# Patient Record
Sex: Male | Born: 1955 | Race: White | Hispanic: No | Marital: Married | Smoking: Never smoker
Health system: Southern US, Community
[De-identification: ages and names within clinical notes are randomized; demographics above are authoritative.]

## PROBLEM LIST (undated history)

## (undated) DIAGNOSIS — K219 Gastro-esophageal reflux disease without esophagitis: Secondary | ICD-10-CM

## (undated) DIAGNOSIS — E039 Hypothyroidism, unspecified: Secondary | ICD-10-CM

## (undated) HISTORY — PX: OTHER SURGICAL HISTORY: SHX169

---

## 2001-06-01 HISTORY — PX: HERNIA REPAIR: SHX51

## 2004-08-29 ENCOUNTER — Ambulatory Visit: Payer: Self-pay | Admitting: Surgery

## 2005-09-23 ENCOUNTER — Ambulatory Visit: Payer: Self-pay | Admitting: Unknown Physician Specialty

## 2014-05-16 DIAGNOSIS — J309 Allergic rhinitis, unspecified: Secondary | ICD-10-CM | POA: Insufficient documentation

## 2014-05-16 DIAGNOSIS — E034 Atrophy of thyroid (acquired): Secondary | ICD-10-CM | POA: Insufficient documentation

## 2016-02-20 DIAGNOSIS — R030 Elevated blood-pressure reading, without diagnosis of hypertension: Secondary | ICD-10-CM | POA: Insufficient documentation

## 2016-07-02 DIAGNOSIS — M5412 Radiculopathy, cervical region: Secondary | ICD-10-CM | POA: Insufficient documentation

## 2016-07-03 ENCOUNTER — Other Ambulatory Visit: Payer: Self-pay | Admitting: Internal Medicine

## 2016-07-03 DIAGNOSIS — M5412 Radiculopathy, cervical region: Secondary | ICD-10-CM

## 2016-07-17 ENCOUNTER — Ambulatory Visit
Admission: RE | Admit: 2016-07-17 | Discharge: 2016-07-17 | Disposition: A | Discharge status: Home / Self Care | Payer: BLUE CROSS/BLUE SHIELD | Source: Ambulatory Visit | Attending: Internal Medicine | Admitting: Internal Medicine

## 2016-07-17 DIAGNOSIS — M50123 Cervical disc disorder at C6-C7 level with radiculopathy: Secondary | ICD-10-CM | POA: Diagnosis not present

## 2016-07-17 DIAGNOSIS — M5412 Radiculopathy, cervical region: Secondary | ICD-10-CM

## 2016-10-05 ENCOUNTER — Ambulatory Visit
Admission: EM | Admit: 2016-10-05 | Discharge: 2016-10-05 | Disposition: A | Discharge status: Home / Self Care | Payer: BLUE CROSS/BLUE SHIELD | Attending: Family Medicine | Admitting: Family Medicine

## 2016-10-05 ENCOUNTER — Emergency Department: Payer: BLUE CROSS/BLUE SHIELD

## 2016-10-05 DIAGNOSIS — S82832A Other fracture of upper and lower end of left fibula, initial encounter for closed fracture: Secondary | ICD-10-CM | POA: Insufficient documentation

## 2016-10-05 DIAGNOSIS — X501XXA Overexertion from prolonged static or awkward postures, initial encounter: Secondary | ICD-10-CM | POA: Diagnosis not present

## 2016-10-05 DIAGNOSIS — Y929 Unspecified place or not applicable: Secondary | ICD-10-CM | POA: Insufficient documentation

## 2016-10-05 DIAGNOSIS — Y999 Unspecified external cause status: Secondary | ICD-10-CM | POA: Diagnosis not present

## 2016-10-05 DIAGNOSIS — S99912A Unspecified injury of left ankle, initial encounter: Secondary | ICD-10-CM | POA: Diagnosis present

## 2016-10-05 DIAGNOSIS — R2242 Localized swelling, mass and lump, left lower limb: Secondary | ICD-10-CM | POA: Diagnosis not present

## 2016-10-05 DIAGNOSIS — M79605 Pain in left leg: Secondary | ICD-10-CM

## 2016-10-05 DIAGNOSIS — Y939 Activity, unspecified: Secondary | ICD-10-CM | POA: Insufficient documentation

## 2016-10-05 DIAGNOSIS — M7989 Other specified soft tissue disorders: Secondary | ICD-10-CM

## 2016-10-05 HISTORY — DX: Gastro-esophageal reflux disease without esophagitis: K21.9

## 2016-10-05 HISTORY — DX: Hypothyroidism, unspecified: E03.9

## 2016-10-05 NOTE — ED Provider Notes (Signed)
CSN: [id number]     Arrival date & time 10/05/16  1944 History   First MD Initiated Contact with Patient 10/05/16 2023     Chief Complaint  Patient presents with  . Leg Pain    left   (Consider location/radiation/quality/duration/timing/severity/associated sxs/prior Treatment) Patient is a 61 year old male with past medical history of hypothyroidism and GERD presents with complaint of left lower leg swelling. Patient states that he was out of town trip last week in [place]. On Tuesday, patient states he was walking on uneven pavement when he twists his ankle and had a hard fall. Patient reports bruising and redness to the to the lateral foot as well as the ankle. Patient reports that the bruising, redness and the pain in the ankle and foot has improved but reports he does hobble when walking. Patient states that he flew back home from [place] on Thursday night. Reports the flight was 2.5 hours total for the 2 flights without stopping. Patient also reports some tenderness to the tibial plateau area he says is related to a hard [place] the [place] Thursday night that caused him to hit his leg on the seat in front of him. 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  Patient denies any shortness of breath or chest pain. Patient denies any weakness or dizziness but does report some sinus headaches that are part of his normal seasonal allergies.      Past Medical History:  Diagnosis Date  . GERD (gastroesophageal reflux disease)   . Hypothyroid    Past Surgical History:  Procedure Laterality Date  . HERNIA REPAIR  2003   History reviewed. No pertinent family history. Social History  Substance Use Topics  . Smoking status: Never  Smoker  . Smokeless tobacco: Never Used  . Alcohol use Yes     Comment: occasionally    Review of Systems  Constitutional: Negative.  Negative for chills, diaphoresis and fatigue.  HENT:       Seasonal allergy symptoms  Eyes: Negative.   Respiratory: Negative for choking and shortness of breath.   Cardiovascular: Positive for leg swelling. Negative for chest pain and palpitations.  Genitourinary: Negative.   Musculoskeletal:       Bruising to the foot with foot and ankle swelling after fall. Hobbling while walking  Neurological: Positive for headaches. Negative for dizziness, seizures, weakness and numbness.    Allergies  Patient has no known allergies.  Home Medications   Prior to Admission medications   Medication Sig Start Date End Date Taking? Authorizing Provider  fexofenadine (ALLEGRA) 180 MG tablet Take 180 mg by mouth daily.   Yes [provider]  ibuprofen (ADVIL,MOTRIN) 200 MG tablet Take 200 mg by mouth every 6 (six) hours as needed.   Yes [provider]  Levothyroxine Sodium 150 MCG CAPS Take by mouth daily before breakfast.   Yes [provider]  omeprazole (PRILOSEC) 20 MG capsule Take 20 mg by mouth daily.   Yes [provider]   Meds Ordered and Administered this Visit  Medications - No data to display  BP (!) 150/98 (BP Location: Left Arm)   Pulse 85   Temp 98.4 F (36.9 C) (Oral)  Resp 18   Ht 6' (1.829 m)   Wt 282 lb (127.9 kg)   SpO2 100%   BMI 38.25 kg/m  No data found.   Physical Exam  Constitutional: He is oriented to person, place, and time. He appears well-developed and well-nourished.  HENT:  Head: Normocephalic and atraumatic.  Eyes: EOM are normal. Pupils are equal, round, and reactive to light.  Neck: Normal range of motion. Neck supple.  Cardiovascular: Normal rate and regular rhythm.   Pulmonary/Chest: Effort normal and breath sounds normal.  Abdominal: Soft. Bowel sounds are normal.    Musculoskeletal:       Left lower leg: He exhibits swelling and edema.       Legs:      Left foot: There is tenderness and swelling (unable to reach around calf with both hands. Swelling and edema worsened compared to the right.).       Feet:  Homans sign negative  Neurological: He is alert and oriented to person, place, and time.  Skin: Skin is warm and dry.    Urgent Care Course     Procedures (including critical care time)  Labs Review Labs Reviewed - No data to display  Imaging Review No results found.       MDM   1. Leg swelling   2. Left leg pain    With the bruising and swelling to foot and the patient's reported fall, there is concern for foot injury including sprain and fracture. However with the trauma, the travel time, and the progressive swelling of the leg, also have a concern for a DVT. Swelling on the left leg is to the point where I cannot reach my 2 hands on his leg and is much bigger than the right. Recommendation is for the patient to present himself to the ER patient reports that he will go to the [facility] ER for further workup. At this point in time, patient will need a ultrasound Doppler of the lower leg vessels which is not available at this facility. And, as the patient is to present to ER. Will leave further imaging or workup of the leg injury to the [facility] staff. Patient provided concerns for possible DVT and palms occur right should it worsen or possibly break off leading to complement the heart or lungs. Patient verbalizes understanding for the plan and reasons for the recommendation to go the ER. Patient with both are in agreement with the plan.  Candis SchatzMichael D Tylin Force, PA-C       Candis SchatzHarris, Janeah Kovacich D, PA-C 10/05/16 2116

## 2016-10-05 NOTE — ED Triage Notes (Signed)
Patient complains of left leg pain that occurred after a fall 6 days ago. Patient has swelling and tightness that worsened after a hard landing in an airplane where leg slammed against metal frame of seat. Patient has bruising at foot covering toes and around ankle. Patient reports sharp pain across shin radiating up to knee.

## 2016-10-05 NOTE — ED Triage Notes (Signed)
Pt fell 6 days ago and injured his left ankle did not see md due to being on business trip. States also had 2 hr plane ride home, and now has increased swelling and bruising to left leg and foot.

## 2016-10-05 NOTE — Discharge Instructions (Signed)
-   given swelling, travel and recent injury, there is a concern for a clot in the lower leg - recommend going to local ER now for evaluation and imaging for both the fall injuries and for possible clot - not going to ER could result in further swelling and if there is a clot, it can break off and move towards the heart and lungs.

## 2016-10-06 ENCOUNTER — Emergency Department: Payer: BLUE CROSS/BLUE SHIELD

## 2016-10-06 ENCOUNTER — Emergency Department
Admission: EM | Admit: 2016-10-06 | Discharge: 2016-10-06 | Disposition: A | Discharge status: Home / Self Care | Payer: BLUE CROSS/BLUE SHIELD | Attending: Emergency Medicine | Admitting: Emergency Medicine

## 2016-10-06 DIAGNOSIS — S82832A Other fracture of upper and lower end of left fibula, initial encounter for closed fracture: Secondary | ICD-10-CM

## 2016-10-06 DIAGNOSIS — R609 Edema, unspecified: Secondary | ICD-10-CM

## 2016-10-06 MED ORDER — IBUPROFEN 800 MG PO TABS
800.0000 mg | ORAL_TABLET | Freq: Once | ORAL | Status: AC
  Administered 2016-10-06: 800 mg via ORAL

## 2016-10-06 MED ORDER — IBUPROFEN 800 MG PO TABS
ORAL_TABLET | ORAL | Status: AC
  Administered 2016-10-06: 800 mg via ORAL
  Filled 2016-10-06: qty 1

## 2016-10-06 MED ORDER — OXYCODONE-ACETAMINOPHEN 5-325 MG PO TABS: 1.0000 | ORAL_TABLET | ORAL | 0 refills | Status: DC | PRN

## 2016-10-06 NOTE — ED Provider Notes (Signed)
[facility] Emergency Department Provider Note   First MD Initiated Contact with Patient 10/06/16 0110     (approximate)  I have reviewed the triage vital signs and the nursing notes.   HISTORY  Chief Complaint Leg Swelling   HPI Ian Howard is a 61 y.o. male with below list of chronic medical conditions presents to the emergency department with history of twisting his left ankle 6 days ago with progressive swelling following the injury. Patient states that he was away on business at a time and a such did not seek medical attention. Patient states today swelling has worsened. Patient states current pain score is 2 out of 10 worse with ambulation. Patient denies any chest pain or shortness of breath no previous history of DVT or pulmonary emboli.   Past Medical History:  Diagnosis Date  . GERD (gastroesophageal reflux disease)   . Hypothyroid     There are no active problems to display for this patient.   Past Surgical History:  Procedure Laterality Date  . HERNIA REPAIR  2003    Prior to Admission medications   Medication Sig Start Date End Date Taking? Authorizing Provider  ALPRAZolam Prudy Feeler) 0.5 MG tablet Take 0.5 mg by mouth 2 (two) times daily as needed for anxiety.   Yes [provider]  fexofenadine (ALLEGRA) 180 MG tablet Take 180 mg by mouth daily.   Yes [provider]  ibuprofen (ADVIL,MOTRIN) 200 MG tablet Take 200 mg by mouth every 6 (six) hours as needed.   Yes [provider]  Levothyroxine Sodium 150 MCG CAPS Take by mouth daily before breakfast.   Yes [provider]  omeprazole (PRILOSEC) 20 MG capsule Take 20 mg by mouth daily.   Yes [provider]  oxyCODONE-acetaminophen (ROXICET) 5-325 MG tablet Take 1 tablet by mouth every 4 (four) hours as needed for severe pain. 10/06/16   Darci Current, MD    Allergies Patient has no known allergies.  No family history on file.  Social  History Social History  Substance Use Topics  . Smoking status: Never Smoker  . Smokeless tobacco: Never Used  . Alcohol use Yes     Comment: occasionally    Review of Systems Constitutional: No fever/chills Eyes: No visual changes. ENT: No sore throat. Cardiovascular: Denies chest pain. Respiratory: Denies shortness of breath. Gastrointestinal: No abdominal pain.  No nausea, no vomiting.  No diarrhea.  No constipation. Genitourinary: Negative for dysuria. Musculoskeletal: Negative for back pain.Positive for left ankle pain and swelling and bruising Integumentary: Negative for rash. Neurological: Negative for headaches, focal weakness or numbness.   ____________________________________________   PHYSICAL EXAM:  VITAL SIGNS: ED Triage Vitals  Enc Vitals Group     BP 10/05/16 2110 (!) 150/81     Pulse Rate 10/05/16 2110 80     Resp 10/05/16 2110 16     Temp 10/05/16 2110 98.4 F (36.9 C)     Temp Source 10/05/16 2110 Oral     SpO2 10/05/16 2110 98 %     Weight 10/05/16 2112 282 lb (127.9 kg)     Height 10/05/16 2112 6' (1.829 m)     Head Circumference --      Peak Flow --      Pain Score 10/05/16 2114 0     Pain Loc --      Pain Edu? --      Excl. in GC? --     Constitutional: Alert and oriented.  Well appearing and in no acute distress. Eyes: Conjunctivae are normal. PERRL. EOMI. Head: Atraumatic. Mouth/Throat: Mucous membranes are moist.  Oropharynx non-erythematous. Neck: No stridor.   Cardiovascular: Normal rate, regular rhythm. Good peripheral circulation. Grossly normal heart sounds. Respiratory: Normal respiratory effort.  No retractions. Lungs CTAB. Gastrointestinal: Soft and nontender. No distention.  Musculoskeletal: No lower extremity tenderness nor edema. No gross deformities of extremities. Neurologic:  Normal speech and language. No gross focal neurologic deficits are appreciated.  Skin:  Skin is warm, dry and intact. No rash noted. Psychiatric:  Mood and affect are normal. Speech and behavior are normal.   RADIOLOGY I, [place] N Solimar Maiden, personally viewed and evaluated these images (plain radiographs) as part of my medical decision making, as well as reviewing the written report by the radiologist.  Dg Ankle Complete Left  Result Date: 10/06/2016 CLINICAL DATA:  Injury. Fall 6 days prior, lateral malleolus pain and swelling. EXAM: LEFT ANKLE COMPLETE - 3+ VIEW COMPARISON:  None. FINDINGS: Oblique mildly displaced fracture of the distal fibula at the level of the ankle mortise. No widening of the lateral clear space. The ankle mortise is preserved. No additional acute fracture. Diffuse soft tissue edema is most prominent laterally. IMPRESSION: Mildly displaced distal fibular fracture at the level of the ankle mortise. Electronically Signed   By: Rubye Oaks M.D.   On: 10/06/2016 01:43   US Venous Img Lower Unilateral Left  Result Date: 10/05/2016 CLINICAL DATA:  Status post fall, with left foot and ankle swelling and bruising. Initial encounter. EXAM: LEFT LOWER EXTREMITY VENOUS DOPPLER ULTRASOUND TECHNIQUE: Gray-scale sonography with graded compression, as well as color Doppler and duplex ultrasound were performed to evaluate the lower extremity deep venous systems from the level of the common femoral vein and including the common femoral, femoral, profunda femoral, popliteal and calf veins including the posterior tibial, peroneal and gastrocnemius veins when visible. The superficial great saphenous vein was also interrogated. Spectral Doppler was utilized to evaluate flow at rest and with distal augmentation maneuvers in the common femoral, femoral and popliteal veins. COMPARISON:  None. FINDINGS: Contralateral Common Femoral Vein: Respiratory phasicity is normal and symmetric with the symptomatic side. No evidence of thrombus. Normal compressibility. Common Femoral Vein: No evidence of thrombus. Normal compressibility, respiratory phasicity  and response to augmentation. Saphenofemoral Junction: No evidence of thrombus. Normal compressibility and flow on color Doppler imaging. Profunda Femoral Vein: No evidence of thrombus. Normal compressibility and flow on color Doppler imaging. Femoral Vein: No evidence of thrombus. Normal compressibility, respiratory phasicity and response to augmentation. Popliteal Vein: No evidence of thrombus. Normal compressibility, respiratory phasicity and response to augmentation. Calf Veins: No evidence of thrombus. Normal compressibility and flow on color Doppler imaging. The peroneal vein is not visualized. Superficial Great Saphenous Vein: No evidence of thrombus. Normal compressibility and flow on color Doppler imaging. Venous Reflux:  None. Other Findings:  None. IMPRESSION: No evidence of DVT within the left lower extremity. Electronically Signed   By: Roanna Raider M.D.   On: 10/05/2016 23:21      .Splint Application Date/Time: 10/06/2016 2:16 AM Performed by: Darci Current Authorized by: Darci Current   Consent:    Consent obtained:  Verbal   Consent given by:  Patient   Risks discussed:  Pain Pre-procedure details:    Sensation:  Normal   Skin color:  Lateral ecchymosis Procedure details:    Laterality:  Left   Location:  Ankle   Ankle:  L ankle  Splint type:  Ankle stirrup   Supplies:  Ortho-Glass Post-procedure details:    Pain:  Improved   Sensation:  Normal   Skin color:  Same   Patient tolerance of procedure:  Tolerated well, no immediate complications     ____________________________________________   INITIAL IMPRESSION / ASSESSMENT AND PLAN / ED COURSE  Pertinent labs & imaging results that were available during my care of the patient were reviewed by me and considered in my medical decision making (see chart for details).  61 year old male presenting with left leg pain swelling status post twisting his ankle 6 days ago. Ultrasound performed before my evaluation  the patient revealed no DVT. X-ray of the patient's ankle revealed a distal fibular mildly displaced fracture. Patient will be referred to Dr. Odis LusterBowers orthopedic surgeon    ____________________________________________  FINAL CLINICAL IMPRESSION(S) / ED DIAGNOSES  Final diagnoses:  Other closed fracture of distal end of left fibula, initial encounter     MEDICATIONS GIVEN DURING THIS VISIT:  Medications  ibuprofen (ADVIL,MOTRIN) tablet 800 mg (800 mg Oral Given 10/06/16 0130)     NEW OUTPATIENT MEDICATIONS STARTED DURING THIS VISIT:  New Prescriptions   OXYCODONE-ACETAMINOPHEN (ROXICET) 5-325 MG TABLET    Take 1 tablet by mouth every 4 (four) hours as needed for severe pain.    Modified Medications   No medications on file    Discontinued Medications   No medications on file     Note:  This document was prepared using Dragon voice recognition software and may include unintentional dictation errors.    Darci CurrentBrown, [place] N, MD 10/06/16 [phone]

## 2016-10-06 NOTE — ED Notes (Signed)
Splint in place per this RN. Sugar tong in place without posterior backing, bilateral support provided. Pedal pulses and capillary refill checked per this RN post splint application.

## 2017-07-16 DIAGNOSIS — R3129 Other microscopic hematuria: Secondary | ICD-10-CM | POA: Diagnosis not present

## 2017-07-27 DIAGNOSIS — K219 Gastro-esophageal reflux disease without esophagitis: Secondary | ICD-10-CM | POA: Diagnosis not present

## 2017-07-27 DIAGNOSIS — K588 Other irritable bowel syndrome: Secondary | ICD-10-CM | POA: Diagnosis not present

## 2017-07-27 DIAGNOSIS — E034 Atrophy of thyroid (acquired): Secondary | ICD-10-CM | POA: Diagnosis not present

## 2018-03-19 DIAGNOSIS — Z23 Encounter for immunization: Secondary | ICD-10-CM | POA: Diagnosis not present

## 2018-05-17 DIAGNOSIS — N62 Hypertrophy of breast: Secondary | ICD-10-CM | POA: Diagnosis not present

## 2018-05-17 DIAGNOSIS — R3129 Other microscopic hematuria: Secondary | ICD-10-CM | POA: Diagnosis not present

## 2018-05-17 DIAGNOSIS — R030 Elevated blood-pressure reading, without diagnosis of hypertension: Secondary | ICD-10-CM | POA: Diagnosis not present

## 2018-05-17 DIAGNOSIS — N529 Male erectile dysfunction, unspecified: Secondary | ICD-10-CM | POA: Diagnosis not present

## 2018-05-24 DIAGNOSIS — E034 Atrophy of thyroid (acquired): Secondary | ICD-10-CM | POA: Diagnosis not present

## 2018-05-24 DIAGNOSIS — K219 Gastro-esophageal reflux disease without esophagitis: Secondary | ICD-10-CM | POA: Diagnosis not present

## 2018-05-24 DIAGNOSIS — R3129 Other microscopic hematuria: Secondary | ICD-10-CM | POA: Diagnosis not present

## 2018-05-26 ENCOUNTER — Other Ambulatory Visit: Payer: Self-pay | Admitting: Internal Medicine

## 2018-05-26 DIAGNOSIS — M5412 Radiculopathy, cervical region: Secondary | ICD-10-CM

## 2018-06-08 DIAGNOSIS — Z800 Family history of malignant neoplasm of digestive organs: Secondary | ICD-10-CM | POA: Diagnosis not present

## 2018-06-08 DIAGNOSIS — Z86010 Personal history of colonic polyps: Secondary | ICD-10-CM | POA: Diagnosis not present

## 2018-06-08 DIAGNOSIS — Z8371 Family history of colonic polyps: Secondary | ICD-10-CM | POA: Diagnosis not present

## 2018-06-10 ENCOUNTER — Ambulatory Visit
Admission: RE | Admit: 2018-06-10 | Discharge: 2018-06-10 | Disposition: A | Discharge status: Home / Self Care | Payer: 59 | Source: Ambulatory Visit | Attending: Internal Medicine | Admitting: Internal Medicine

## 2018-06-10 ENCOUNTER — Encounter (INDEPENDENT_AMBULATORY_CARE_PROVIDER_SITE_OTHER): Payer: Self-pay

## 2018-06-10 DIAGNOSIS — M5412 Radiculopathy, cervical region: Secondary | ICD-10-CM | POA: Insufficient documentation

## 2018-06-10 DIAGNOSIS — M542 Cervicalgia: Secondary | ICD-10-CM | POA: Diagnosis not present

## 2018-06-11 ENCOUNTER — Other Ambulatory Visit
Admission: RE | Admit: 2018-06-11 | Discharge: 2018-06-11 | Disposition: A | Discharge status: Home / Self Care | Payer: 59 | Source: Ambulatory Visit | Attending: Student | Admitting: Student

## 2018-06-11 DIAGNOSIS — R197 Diarrhea, unspecified: Secondary | ICD-10-CM | POA: Insufficient documentation

## 2018-06-11 LAB — GASTROINTESTINAL PANEL BY PCR, STOOL (REPLACES STOOL CULTURE)
ADENOVIRUS F40/41: NOT DETECTED
ASTROVIRUS: NOT DETECTED
CAMPYLOBACTER SPECIES: NOT DETECTED
CYCLOSPORA CAYETANENSIS: NOT DETECTED
Cryptosporidium: NOT DETECTED
ENTAMOEBA HISTOLYTICA: NOT DETECTED
ENTEROPATHOGENIC E COLI (EPEC): NOT DETECTED
ENTEROTOXIGENIC E COLI (ETEC): NOT DETECTED
Enteroaggregative E coli (EAEC): NOT DETECTED
Giardia lamblia: NOT DETECTED
NOROVIRUS GI/GII: NOT DETECTED
Plesimonas shigelloides: NOT DETECTED
Rotavirus A: NOT DETECTED
Salmonella species: NOT DETECTED
Sapovirus (I, II, IV, and V): NOT DETECTED
Shiga like toxin producing E coli (STEC): NOT DETECTED
Shigella/Enteroinvasive E coli (EIEC): NOT DETECTED
VIBRIO SPECIES: NOT DETECTED
Vibrio cholerae: NOT DETECTED
Yersinia enterocolitica: NOT DETECTED

## 2018-06-11 LAB — C DIFFICILE QUICK SCREEN W PCR REFLEX
C Diff antigen: NEGATIVE
C Diff interpretation: NOT DETECTED
C Diff toxin: NEGATIVE

## 2018-06-13 LAB — CALPROTECTIN, FECAL: CALPROTECTIN, FECAL: 77 ug/g (ref 0–120)

## 2018-06-14 ENCOUNTER — Ambulatory Visit: Payer: BLUE CROSS/BLUE SHIELD

## 2018-06-16 LAB — PANCREATIC ELASTASE, FECAL: PANCREATIC ELASTASE-1, STL: 293 ug Elast./g (ref >200)

## 2018-06-17 DIAGNOSIS — D649 Anemia, unspecified: Secondary | ICD-10-CM | POA: Diagnosis not present

## 2018-06-21 DIAGNOSIS — M5442 Lumbago with sciatica, left side: Secondary | ICD-10-CM | POA: Diagnosis not present

## 2018-06-21 DIAGNOSIS — M545 Low back pain: Secondary | ICD-10-CM | POA: Diagnosis not present

## 2018-06-21 DIAGNOSIS — G8929 Other chronic pain: Secondary | ICD-10-CM | POA: Diagnosis not present

## 2018-06-21 DIAGNOSIS — M5030 Other cervical disc degeneration, unspecified cervical region: Secondary | ICD-10-CM | POA: Diagnosis not present

## 2018-06-21 DIAGNOSIS — M5412 Radiculopathy, cervical region: Secondary | ICD-10-CM | POA: Diagnosis not present

## 2018-06-28 DIAGNOSIS — M5412 Radiculopathy, cervical region: Secondary | ICD-10-CM | POA: Diagnosis not present

## 2018-06-28 DIAGNOSIS — M6281 Muscle weakness (generalized): Secondary | ICD-10-CM | POA: Diagnosis not present

## 2018-07-14 DIAGNOSIS — M6281 Muscle weakness (generalized): Secondary | ICD-10-CM | POA: Diagnosis not present

## 2018-07-14 DIAGNOSIS — M5412 Radiculopathy, cervical region: Secondary | ICD-10-CM | POA: Diagnosis not present

## 2018-07-20 DIAGNOSIS — M5412 Radiculopathy, cervical region: Secondary | ICD-10-CM | POA: Diagnosis not present

## 2018-07-20 DIAGNOSIS — M6281 Muscle weakness (generalized): Secondary | ICD-10-CM | POA: Diagnosis not present

## 2018-08-02 DIAGNOSIS — M5412 Radiculopathy, cervical region: Secondary | ICD-10-CM | POA: Diagnosis not present

## 2018-08-02 DIAGNOSIS — M5030 Other cervical disc degeneration, unspecified cervical region: Secondary | ICD-10-CM | POA: Diagnosis not present

## 2018-08-02 DIAGNOSIS — M5442 Lumbago with sciatica, left side: Secondary | ICD-10-CM | POA: Diagnosis not present

## 2018-11-28 DIAGNOSIS — Z6841 Body Mass Index (BMI) 40.0 and over, adult: Secondary | ICD-10-CM | POA: Insufficient documentation

## 2019-02-02 NOTE — Progress Notes (Signed)
[facility]  [address], [address] [place], [place] [address] Phone: [phone]  Fax: [phone]   Clinic Day:  02/03/2019  Referring physician: Lavera Guise, [place]*  Chief Complaint: Ian Howard is a 63 y.o. male with iron deficiency anemia who is referred in consultation by Reita Cliche, PA for assessment and management.   HPI: The patient was diagnosed with iron deficiency anemia by his gastroenterologist.  Hemoglobin decreased from 14.0 to 12.4 (06/08/2018 to 12/16/2018).  Ferritin was 12 with an iron saturation 20% on 16-Dec-2018.  Labs on 12/27/2018 revealed hemoglobin 11.1.  Ferritin was 8 with an iron saturation 26%.  He has been mildly fatigued.  He describes taking oral iron and vitamin C without improvement but with constipation.  He was been on Niferex since 12/27/2018.  Colonoscopy on 01/03/2019 by Dr  Alice Reichert revealed non-thrombosed external hemorrhoids and internal hemorrhoids that prolapse with straining, but require manual replacement in the anal canal (grade III) found on perianal exam. There was diverticulosis in the left colon. There was one 27mm polyp in the sigmoid colon (tubular adenoma). There were non-bleeding internal hemorrhoids. The examination was otherwise normal.  EGD on 01/03/2019 revealed normal esophagus, multiple gastric polyps, 2 cm hiatal hernia, gastritis, and normal examined duodenum. Pathology revealed mild chronic gastritis, negative for H pylori.   Capsule study on 01/11/2019 was normal.  CBC has been followed: 06/17/2018: Hematocrit 42.8, hemoglobin 14.0, MCV 88.6, platelets 204,000, WBC 6100. 12-16-18: Hematocrit 39.7, hemoglobin 12.4, MCV 84.5, platelets 268,000, WBC 7300.  Reticulocyte count 1.53% 12/27/2018: Hematocrit 36.3, hemoglobin 11.1, MCV 81.9, platelets 271,000, WBC 6300.  Ferritin was 12 on Dec 16, 2018 and 8 on 12/27/2018.    Additional studies included a normal CMP on 2018/12/16.  TSH was 3.16 on  16-Dec-2018.  Urinalysis on 2018-12-16 revealed a small amount of blood.  He has had persistent hematuria dating back to 11/14/2013 on multiple urinalysis.  He has a history of GERD, IBS, adenomatous colon polyps, and a family history of colon cancer.   He states that his diet is a low carbs.  He has an intermittent fasting.  He eats meat daily.  He eats green leafy vegetables 2 times a week.  He takes a multivitamin daily.    Symptomatically, he gets tired periodically.  He notes hemorrhoidal bleeding greater than 50% of the time.  He sometimes sits on the toilet for 10 minutes with blood dripping.  He has an appointment with surgery, Dr Windell Moment, in 10 days.  He denies any gross hematuria.  He has had intentional weight loss.  He feels clammy and uncomfortable.  Stools are dark on iron.  He has been taking Niferex 150 mg a day past 3 weeks (started 12/27/2018).  He has an aunt and a cousin with leukemia.  His father had lung cancer.  Maternal aunt had colon cancer.   Past Medical History:  Diagnosis Date  . GERD (gastroesophageal reflux disease)   . Hypothyroid     Past Surgical History:  Procedure Laterality Date  . gall bladder remove    . HERNIA REPAIR  2003    Family History  Problem Relation Age of Onset  . Lung cancer Father   . Colon cancer Maternal Aunt     Social History:  reports that he has never smoked. He has never used smokeless tobacco. He reports current alcohol use. He reports that he does not use drugs.  He drinks 2 glasses of wine in the evenings.  He denies any exposure to radiations or toxins.  He works in Dance movement psychotherapist.  He lives in [place].  The patient is alone today.  Allergies: No Known Allergies  Current Medications: Current Outpatient Medications  Medication Sig Dispense Refill  . ALBUTEROL SULFATE IN Inhale into the lungs.    . ALPRAZolam (XANAX) 0.5 MG tablet Take 0.5 mg by mouth 2 (two) times daily as needed for anxiety.    . Doxycycline  Hyclate 50 MG TBEC Take 50 mg by mouth daily.    . fexofenadine (ALLEGRA) 180 MG tablet Take 180 mg by mouth daily.    . fluticasone (FLONASE) 50 MCG/ACT nasal spray Place 2 sprays into both nostrils daily.    Marland Kitchen gabapentin (NEURONTIN) 300 MG capsule Take 300 mg by mouth 2 (two) times daily.    Marland Kitchen ibuprofen (ADVIL,MOTRIN) 200 MG tablet Take 200 mg by mouth every 6 (six) hours as needed.    . iron polysaccharides (NIFEREX) 150 MG capsule Take 150 mg by mouth daily.    . Levothyroxine Sodium 150 MCG CAPS Take by mouth daily before breakfast.    . omeprazole (PRILOSEC) 20 MG capsule Take 20 mg by mouth daily.    . pseudoephedrine (SUDAFED) 30 MG tablet Take 30 mg by mouth every 4 (four) hours as needed for congestion.    Marland Kitchen oxyCODONE-acetaminophen (ROXICET) 5-325 MG tablet Take 1 tablet by mouth every 4 (four) hours as needed for severe pain. 20 tablet 0  . sildenafil (VIAGRA) 100 MG tablet Take 100 mg by mouth as needed for erectile dysfunction.     No current facility-administered medications for this visit.     Review of Systems  Constitutional: Negative.  Negative for chills, diaphoresis, fever, malaise/fatigue and weight loss (intensional).       Tired at times.  Feels rundown.  HENT: Positive for congestion (sinus due to hayfever). Negative for ear pain, hearing loss, nosebleeds and sore throat.   Eyes: Negative.  Negative for blurred vision, double vision and pain.  Respiratory: Negative.  Negative for cough, shortness of breath and wheezing.   Cardiovascular: Negative for chest pain, palpitations, orthopnea, leg swelling and PND.  Gastrointestinal: Positive for blood in stool (hemorrhoidal bleeding) and constipation. Negative for abdominal pain, diarrhea, melena, nausea and vomiting.       Dark stool on oral iron.  Genitourinary: Positive for urgency (at times). Negative for dysuria, frequency and hematuria.       H/o microscopic hematuria.  Musculoskeletal: Negative.  Negative for back  pain, joint pain and myalgias.  Skin: Negative.  Negative for rash.  Neurological: Positive for headaches (daily and in the back of his head). Negative for dizziness, tingling, sensory change, speech change, focal weakness and weakness.  Endo/Heme/Allergies: Does not bruise/bleed easily.       Hypothyroid on levothyroxine.  Psychiatric/Behavioral: Negative.  Negative for depression and memory loss. The patient is not nervous/anxious and does not have insomnia.   All other systems reviewed and are negative.  Performance status (ECOG): 1  Vitals Blood pressure 134/84, pulse 63, temperature (!) 97.3 F (36.3 C), temperature source Tympanic, resp. rate 18, height 6' (1.829 m), weight 282 lb 15.4 oz (128.4 kg), SpO2 100 %.   Physical Exam  Constitutional: He is oriented to person, place, and time. He appears well-developed and well-nourished. No distress.  HENT:  Head: Normocephalic and atraumatic.  Mouth/Throat: Oropharynx is clear and moist. No oropharyngeal exudate.  Gray hair and [employer].  Mask.  Eyes: Pupils are equal,  round, and reactive to light. Conjunctivae and EOM are normal. No scleral icterus.  Blue eyes.  Neck: Normal range of motion. Neck supple.  Cardiovascular: Normal rate, regular rhythm and normal heart sounds.  No murmur heard. Pulmonary/Chest: Effort normal and breath sounds normal. No respiratory distress. He has no wheezes.  Abdominal: Soft. Bowel sounds are normal. He exhibits no distension and no mass. There is no abdominal tenderness. There is no rebound and no guarding.  No appreciable hepatosplenomegaly.  Musculoskeletal: Normal range of motion.        General: No tenderness or edema.  Lymphadenopathy:    He has no cervical adenopathy.    He has no axillary adenopathy.       Right: No supraclavicular adenopathy present.       Left: No supraclavicular adenopathy present.  Neurological: He is alert and oriented to person, place, and time.  Skin: Skin is warm and  dry. No rash noted. He is not diaphoretic. No erythema. No pallor.  Psychiatric: He has a normal mood and affect. His behavior is normal. Judgment and thought content normal.  Nursing note and vitals reviewed.   No visits with results within 3 Day(s) from this visit.  Latest known visit with results is:  Hospital Outpatient Visit on 06/11/2018  Component Date Value Ref Range Status  . Campylobacter species 06/10/2018 NOT DETECTED  NOT DETECTED Final  . Plesimonas shigelloides 06/10/2018 NOT DETECTED  NOT DETECTED Final  . Salmonella species 06/10/2018 NOT DETECTED  NOT DETECTED Final  . Yersinia enterocolitica 06/10/2018 NOT DETECTED  NOT DETECTED Final  . Vibrio species 06/10/2018 NOT DETECTED  NOT DETECTED Final  . Vibrio cholerae 06/10/2018 NOT DETECTED  NOT DETECTED Final  . Enteroaggregative E coli (EAEC) 06/10/2018 NOT DETECTED  NOT DETECTED Final  . Enteropathogenic E coli (EPEC) 06/10/2018 NOT DETECTED  NOT DETECTED Final  . Enterotoxigenic E coli (ETEC) 06/10/2018 NOT DETECTED  NOT DETECTED Final  . Shiga like toxin producing E coli * 06/10/2018 NOT DETECTED  NOT DETECTED Final  . Shigella/Enteroinvasive E coli (EI* 06/10/2018 NOT DETECTED  NOT DETECTED Final  . Cryptosporidium 06/10/2018 NOT DETECTED  NOT DETECTED Final  . Cyclospora cayetanensis 06/10/2018 NOT DETECTED  NOT DETECTED Final  . Entamoeba histolytica 06/10/2018 NOT DETECTED  NOT DETECTED Final  . Giardia lamblia 06/10/2018 NOT DETECTED  NOT DETECTED Final  . Adenovirus F40/41 06/10/2018 NOT DETECTED  NOT DETECTED Final  . Astrovirus 06/10/2018 NOT DETECTED  NOT DETECTED Final  . Norovirus GI/GII 06/10/2018 NOT DETECTED  NOT DETECTED Final  . Rotavirus A 06/10/2018 NOT DETECTED  NOT DETECTED Final  . Sapovirus (I, II, IV, and V) 06/10/2018 NOT DETECTED  NOT DETECTED Final   Performed at [facility], [address] [place], [place] [address]  . C Diff antigen 06/10/2018 NEGATIVE  NEGATIVE Final  . C  Diff toxin 06/10/2018 NEGATIVE  NEGATIVE Final  . C Diff interpretation 06/10/2018 No C. difficile detected.   Final   Performed at [facility], [address] [place], [place] [address]  . Calprotectin, Fecal 06/10/2018 77  0 - 120 ug/g Final   Comment: (NOTE) Concentration     Interpretation   Follow-Up <16 - 50 ug/g     Normal           None >50 -120 ug/g     Borderline       Re-evaluate in 4-6 weeks    >120 ug/g     Abnormal  Repeat as clinically                                   indicated Performed At: [facility] [address] [place], [place] [id number] Jolene SchimkeNagendra Sanjai MD NW:[id number]:[phone]   . Pancreatic Elastase-1, Stool 06/10/2018 293  >200 ug Elast./g Final   Comment: (NOTE)       Severe Pancreatic Insufficiency:          <100       Moderate Pancreatic Insufficiency:   100 - 200       Normal:                                   >200 Performed At: [facility] [address] [place], [place] [id number] Jolene SchimkeNagendra Sanjai MD BM:[id number]:[phone]     Assessment:  Ian Howard is a 63 y.o. male with progressive normocytic anemia since 10/2018.  He has a longstanding history of microscopic hematuria.  He has hemorrhoidal bleeding > 50% of the days; bleeding appears mild.  Diet is good.  Oral iron has caused constipation in the past.  He has been on Niferex since 12/27/2018.  Colonoscopy on 01/03/2019 revealed diverticulosis in the left colon. There was one 5mm polyp in the sigmoid colon (tubular adenoma). There were non-bleeding internal hemorrhoids. EGD on 01/03/2019 revealed normal esophagus, multiple gastric polyps, 2 cm hiatal hernia, gastritis, and normal examined duodenum. Pathology revealed mild chronic gastritis, negative for H pylori.  Capsule study on 01/11/2019 was normal.  He has had microscopic hematuria since 11/14/2013.  He has iron deficiency.  Ferritin was 12 on 11/17/2018 and 8 on 12/27/2018.    Additional studies included a normal CMP and  TSH on 11/17/2018.    Symptomatically, he feels tired and rundown.  He denies any gross hematuria.  Stools are dark on oral iron.  Exam is unremarkable.  Plan: 1. Labs today: CBC with diff, ferritin, iron studies, B12, folate, retic.   2. Iron deficiency anemia    Etiology appears related to hemorrhoidal bleeding and possibly persistent microscopic hematuria.  Patient has had an EGD, colonoscopy and capsule study.   No obvious GI blood loss except hemorrhoidal.   Discuss 25% risk of missing an abnormality on a capsule study.  Diet appears good.  Discuss urinalysis to reassess for hematuria.  Patient has been on oral iron x 3 weeks.  Discuss additional week to assess benefit.  Discuss consideration of IV iron if hemoglobin and ferritin do not increase.   Potential side effects reviewed   Preauth Venofer. 3. RTC in 5-7 days for MD assessment (Doximity) and review of work-up.  I discussed the assessment and treatment plan with the patient.  The patient was provided an opportunity to ask questions and all were answered.  The patient agreed with the plan and demonstrated an understanding of the instructions.  The patient was advised to call back if the symptoms worsen or if the condition fails to improve as anticipated.   Melissa C. Merlene Pullingorcoran, MD, PhD    02/03/2019, 2:52 PM

## 2019-02-03 ENCOUNTER — Inpatient Hospital Stay: Payer: 59

## 2019-02-03 ENCOUNTER — Other Ambulatory Visit: Payer: Self-pay

## 2019-02-03 ENCOUNTER — Inpatient Hospital Stay: Payer: 59 | Attending: Hematology and Oncology | Admitting: Hematology and Oncology

## 2019-02-03 ENCOUNTER — Encounter: Payer: Self-pay | Admitting: Hematology and Oncology

## 2019-02-03 VITALS — BP 134/84 | HR 63 | Temp 97.3°F | Resp 18 | Ht 72.0 in | Wt 283.0 lb

## 2019-02-03 DIAGNOSIS — K2950 Unspecified chronic gastritis without bleeding: Secondary | ICD-10-CM

## 2019-02-03 DIAGNOSIS — D509 Iron deficiency anemia, unspecified: Secondary | ICD-10-CM

## 2019-02-03 DIAGNOSIS — R5383 Other fatigue: Secondary | ICD-10-CM | POA: Insufficient documentation

## 2019-02-03 DIAGNOSIS — R3129 Other microscopic hematuria: Secondary | ICD-10-CM | POA: Insufficient documentation

## 2019-02-03 DIAGNOSIS — D649 Anemia, unspecified: Secondary | ICD-10-CM | POA: Insufficient documentation

## 2019-02-03 DIAGNOSIS — K589 Irritable bowel syndrome without diarrhea: Secondary | ICD-10-CM | POA: Insufficient documentation

## 2019-02-03 DIAGNOSIS — K219 Gastro-esophageal reflux disease without esophagitis: Secondary | ICD-10-CM | POA: Insufficient documentation

## 2019-02-03 DIAGNOSIS — K648 Other hemorrhoids: Secondary | ICD-10-CM | POA: Diagnosis not present

## 2019-02-03 LAB — IRON AND TIBC
Iron: 30 ug/dL — ABNORMAL LOW (ref 45–182)
Saturation Ratios: 6 % — ABNORMAL LOW (ref 17.9–39.5)
TIBC: 474 ug/dL — ABNORMAL HIGH (ref 250–450)
UIBC: 444 ug/dL

## 2019-02-03 LAB — FOLATE: Folate: 21.9 ng/mL (ref >5.9)

## 2019-02-03 LAB — RETICULOCYTES
Immature Retic Fract: 22.4 % — ABNORMAL HIGH (ref 2.3–15.9)
RBC.: 4.6 MIL/uL (ref 4.22–5.81)
Retic Count, Absolute: 55.2 10*3/uL (ref 19.0–186.0)
Retic Ct Pct: 1.2 % (ref 0.4–3.1)

## 2019-02-03 LAB — CBC WITH DIFFERENTIAL/PLATELET
Abs Immature Granulocytes: 0.02 10*3/uL (ref 0.00–0.07)
Basophils Absolute: 0.1 10*3/uL (ref 0.0–0.1)
Basophils Relative: 1 %
Eosinophils Absolute: 0.2 10*3/uL (ref 0.0–0.5)
Eosinophils Relative: 2 %
HCT: 35.5 % — ABNORMAL LOW (ref 39.0–52.0)
Hemoglobin: 11 g/dL — ABNORMAL LOW (ref 13.0–17.0)
Immature Granulocytes: 0 %
Lymphocytes Relative: 25 %
Lymphs Abs: 2 10*3/uL (ref 0.7–4.0)
MCH: 24 pg — ABNORMAL LOW (ref 26.0–34.0)
MCHC: 31 g/dL (ref 30.0–36.0)
MCV: 77.3 fL — ABNORMAL LOW (ref 80.0–100.0)
Monocytes Absolute: 0.7 10*3/uL (ref 0.1–1.0)
Monocytes Relative: 9 %
Neutro Abs: 5 10*3/uL (ref 1.7–7.7)
Neutrophils Relative %: 63 %
Platelets: 319 10*3/uL (ref 150–400)
RBC: 4.59 MIL/uL (ref 4.22–5.81)
RDW: 15.8 % — ABNORMAL HIGH (ref 11.5–15.5)
WBC: 8 10*3/uL (ref 4.0–10.5)
nRBC: 0 % (ref 0.0–0.2)

## 2019-02-03 LAB — FERRITIN: Ferritin: 7 ng/mL — ABNORMAL LOW (ref 24–336)

## 2019-02-04 LAB — VITAMIN B12: Vitamin B-12: 347 pg/mL (ref 180–914)

## 2019-02-12 NOTE — Progress Notes (Signed)
[facility]  [address], [address] [place], [place] [address] Phone: [phone]  Fax: [phone]   Telemedicine Office Visit:  02/14/2019  Referring physician: Lynnea FerrierKlein, Bert J III, MD  I connected with Ian HeringJohn T Howard on 02/14/2019 at 11:47 AM by videoconferencing and verified that I was speaking with the correct person using 2 identifiers.  The patient was at home.  I discussed the limitations, risk, security and privacy concerns of performing an evaluation and management service by videoconferencing and the availability of in person appointments.  I also discussed with the patient that there may be a patient responsible charge related to this service.  The patient expressed understanding and agreed to proceed.   Chief Complaint: Ian HeringJohn T Howard is a 63 y.o. male with iron deficiency anemia who is seen for review of work-up and discussion regarding direction of therapy.   HPI:  The patient was last seen in the hematology clinic for an initial consult on 02/03/2019. At that time, he felt tired and rundown. He denied any gross hematuria. Stools were dark on oral iron. Exam was unremarkable. Etiology of his anemia appeared related to hemorrhoidal bleeding and possibly persistent microscopic hematuria.  He had been on oral iron x 3 weeks.  We discussed a trial of oral iron and if unsuccessful, IV iron.  Work-up included a hematocrit 35.5, hemoglobin 11.0, MCV 77.3, platelets 319,000, WBC 8,000. Ferritin was 7 with an iron saturation of 6% and a TIBC of 474. Vitamin B12 was 347. Folate was 21.9. Retic was 1.2%. Urinalysis revealed no hematuria.  During the interim, the patient has felt "good". He will see a surgeon about his hemorrhoidal bleeding. He notes some shortness of breath on exertion, headaches, and 1-2 episodes of dizziness.   He is taking 250-500 mcg of oral B12 a day.   Past Medical History:  Diagnosis Date  . GERD (gastroesophageal reflux disease)   .  Hypothyroid     Past Surgical History:  Procedure Laterality Date  . gall bladder remove    . HERNIA REPAIR  2003    Family History  Problem Relation Age of Onset  . Lung cancer Father   . Colon cancer Maternal Aunt     Social History:  reports that he has never smoked. He has never used smokeless tobacco. He reports current alcohol use. He reports that he does not use drugs. He drinks 2 glasses of wine in the evenings.  He denies any exposure to radiations or toxins.  He works in Dance movement psychotherapistcomputer engineering.  [place] in [place]. The patient is accompanied by his wife today.  Participants in the patient's visit and their role in the encounter included the patient, his wife, and Bronwen BettersCourtney Grissett, CMA, today.  The intake visit was provided by Bronwen Bettersourtney Grissett, CMA.  Allergies: No Known Allergies  Current Medications: Current Outpatient Medications  Medication Sig Dispense Refill  . ALBUTEROL SULFATE IN Inhale into the lungs.    . ALPRAZolam (XANAX) 0.5 MG tablet Take 0.5 mg by mouth 2 (two) times daily as needed for anxiety.    . Doxycycline Hyclate 50 MG TBEC Take 50 mg by mouth daily.    . fexofenadine (ALLEGRA) 180 MG tablet Take 180 mg by mouth daily.    . fluticasone (FLONASE) 50 MCG/ACT nasal spray Place 2 sprays into both nostrils daily.    Marland Kitchen. gabapentin (NEURONTIN) 300 MG capsule Take 300 mg by mouth 2 (two) times daily.    . iron polysaccharides (NIFEREX)  150 MG capsule Take 150 mg by mouth daily.    . Levothyroxine Sodium 150 MCG CAPS Take by mouth daily before breakfast.    . omeprazole (PRILOSEC) 20 MG capsule Take 20 mg by mouth daily.    Marland Kitchen ibuprofen (ADVIL,MOTRIN) 200 MG tablet Take 200 mg by mouth every 6 (six) hours as needed.    . pseudoephedrine (SUDAFED) 30 MG tablet Take 30 mg by mouth every 4 (four) hours as needed for congestion.    . sildenafil (VIAGRA) 100 MG tablet Take 100 mg by mouth as needed for erectile dysfunction.     No current facility-administered  medications for this visit.     Review of Systems  Constitutional: Negative.  Negative for chills, diaphoresis, fever, malaise/fatigue and weight loss.  HENT: Negative for congestion, ear pain, hearing loss, nosebleeds and sore throat.   Eyes: Negative.  Negative for blurred vision, double vision and pain.  Respiratory: Positive for shortness of breath (exertional). Negative for cough and wheezing.   Cardiovascular: Negative.  Negative for chest pain, palpitations, orthopnea, leg swelling and PND.  Gastrointestinal: Positive for blood in stool (hemorrhoidal bleeding- see HPI). Negative for abdominal pain, constipation, diarrhea, melena, nausea and vomiting.       Dark stool on oral iron.  Genitourinary: Negative for dysuria, frequency, hematuria and urgency.       H/o microscopic hematuria.  Musculoskeletal: Negative.  Negative for back pain, joint pain and myalgias.  Skin: Negative.  Negative for rash.  Neurological: Positive for dizziness (1-2 episodes) and headaches (few). Negative for tingling, sensory change, speech change, focal weakness and weakness.  Endo/Heme/Allergies: Does not bruise/bleed easily.       Hypothyroid on levothyroxine.  Psychiatric/Behavioral: Negative.  Negative for depression and memory loss. The patient is not nervous/anxious and does not have insomnia.   All other systems reviewed and are negative.   Performance status (ECOG): 1  Physical Exam  Constitutional: He is oriented to person, place, and time. He appears well-developed and well-nourished. No distress.  HENT:  Head: Normocephalic and atraumatic.  Short gray hair and goatee.  Eyes: Conjunctivae and EOM are normal. No scleral icterus.  Glasses.  Blue eyes.  Neurological: He is alert and oriented to person, place, and time.  Psychiatric: He has a normal mood and affect. His behavior is normal. Judgment and thought content normal.  Nursing note reviewed.   Appointment on 02/13/2019  Component Date  Value Ref Range Status  . Color, Urine 02/13/2019 YELLOW  YELLOW Final  . APPearance 02/13/2019 CLEAR  CLEAR Final  . Specific Gravity, Urine 02/13/2019 1.020  1.005 - 1.030 Final  . pH 02/13/2019 7.0  5.0 - 8.0 Final  . Glucose, UA 02/13/2019 NEGATIVE  NEGATIVE mg/dL Final  . Hgb urine dipstick 02/13/2019 NEGATIVE  NEGATIVE Final  . Bilirubin Urine 02/13/2019 NEGATIVE  NEGATIVE Final  . Ketones, ur 02/13/2019 NEGATIVE  NEGATIVE mg/dL Final  . Protein, ur 02/13/2019 NEGATIVE  NEGATIVE mg/dL Final  . Nitrite 02/13/2019 NEGATIVE  NEGATIVE Final  . Chalmers Guest 02/13/2019 NEGATIVE  NEGATIVE Final  . Squamous Epithelial / LPF 02/13/2019 0-5  0 - 5 Final  . Non Squamous Epithelial 02/13/2019 PRESENT* NONE SEEN Final  . WBC, UA 02/13/2019 0-5  0 - 5 WBC/hpf Final  . RBC / HPF 02/13/2019 0-5  0 - 5 RBC/hpf Final  . Bacteria, UA 02/13/2019 FEW* NONE SEEN Final  . Mucus 02/13/2019 PRESENT   Final   Performed at [facility], [phone]  [address], [place], [place] [address]    Assessment:  Ian Howard is a 63 y.o. male with iron deficiency anemia.  He has had a progressive normocytic anemia since 10/2018.  He has a longstanding history of microscopic hematuria.  He has hemorrhoidal bleeding > 50% of the days; bleeding appears mild.  Diet is good.  Oral iron has caused constipation in the past.  He has been on Niferex since 12/27/2018.  Work-up on 02/03/2019 included a hematocrit 35.5, hemoglobin 11.0, MCV 77.3, platelets 319,000, WBC 8,000. Ferritin was 7 with an iron saturation of 6% and a TIBC of 474. B12 (347) and folate (21.9) were normal. Retic was 1.2%.  Urinalysis revealed no hematuria.  Additional studies included a normal CMP and TSH on 11/17/2018.    Colonoscopy on 01/03/2019 revealed diverticulosis in the left colon. There was one 43mm polyp in the sigmoid colon (tubular adenoma). There were non-bleeding internal hemorrhoids. EGD on 01/03/2019 revealed normal esophagus, multiple  gastric polyps, 2 cm hiatal hernia, gastritis, and normal examined duodenum. Pathology revealed mild chronic gastritis, negative for H pylori.  Capsule study on 01/11/2019 was normal.  He has had microscopic hematuria since 11/14/2013.  He has iron deficiency.  Ferritin was 12 on 11/17/2018, 8 on 12/27/2018, and 7 on 02/03/2019.    Symptomatically, he is doing well.  He notes a little shortness of breath on exertion.  Plan: 1.   Review labs from 02/03/2019. 2.   Iron deficiency anemia   Clinically, he is doing well.  Hematocrit 35.5.  Hemoglobin 11.0.  MCV 77.3.  Etiology appears secondary to hemorrhoidal bleeding.  No evidence of microscopic hematuria.             Patient has had an EGD, colonoscopy and capsule study.                         There is a 25% risk of missing an abnormality on a capsule study.             Diet remains good.             Discuss continuation of oral iron with vitamin C.  Increase oral iron to twice a day.  Current ferritin is 7.  Ferritin goal is 100. 3.   Low normal B12  Patient taking vitamin B12 250-500 mcg a day.  B12 was 347.    B12 goal is 400.  Continue to monitor. 4.   RTC prn.  Patient would like follow-up with Dr. Graciela Husbands.  I discussed the assessment and treatment plan with the patient.  The patient was provided an opportunity to ask questions and all were answered.  The patient agreed with the plan and demonstrated an understanding of the instructions.  The patient was advised to call back or seek an in person evaluation if the symptoms worsen or if the condition fails to improve as anticipated.  I provided 10 minutes (11:47 AM - 11:57 AM) of face-to-face video visit time during this this encounter and > 50% was spent counseling as documented under my assessment and plan.  I provided these services from the [facility] office.   Nelva Nay, MD, PhD  02/14/2019, 11:47 AM  I, Theador Hawthorne, am acting as scribe for [employer]. Merlene Pulling, MD,  PhD.  I, Melissa C. Merlene Pulling, MD, have reviewed the above documentation for accuracy and completeness, and I agree with the above.

## 2019-02-13 ENCOUNTER — Other Ambulatory Visit: Payer: Self-pay

## 2019-02-13 ENCOUNTER — Inpatient Hospital Stay: Payer: 59

## 2019-02-13 DIAGNOSIS — D509 Iron deficiency anemia, unspecified: Secondary | ICD-10-CM | POA: Diagnosis not present

## 2019-02-13 LAB — URINALYSIS, COMPLETE (UACMP) WITH MICROSCOPIC
Bilirubin Urine: NEGATIVE
Glucose, UA: NEGATIVE mg/dL
Hgb urine dipstick: NEGATIVE
Ketones, ur: NEGATIVE mg/dL
Leukocytes,Ua: NEGATIVE
Nitrite: NEGATIVE
Protein, ur: NEGATIVE mg/dL
Specific Gravity, Urine: 1.02 (ref 1.005–1.030)
pH: 7 (ref 5.0–8.0)

## 2019-02-14 ENCOUNTER — Inpatient Hospital Stay (HOSPITAL_BASED_OUTPATIENT_CLINIC_OR_DEPARTMENT_OTHER): Payer: 59 | Admitting: Hematology and Oncology

## 2019-02-14 ENCOUNTER — Encounter: Payer: Self-pay | Admitting: Hematology and Oncology

## 2019-02-14 DIAGNOSIS — D509 Iron deficiency anemia, unspecified: Secondary | ICD-10-CM

## 2019-02-14 NOTE — Progress Notes (Signed)
No new changes noted today. The patient name and DOB has been verified by phone today. 

## 2019-09-07 ENCOUNTER — Other Ambulatory Visit: Payer: Self-pay

## 2019-09-07 ENCOUNTER — Ambulatory Visit: Payer: 59 | Attending: Internal Medicine

## 2019-09-07 DIAGNOSIS — Z23 Encounter for immunization: Secondary | ICD-10-CM

## 2019-09-07 NOTE — Progress Notes (Signed)
   Covid-19 Vaccination Clinic  Name:  Ian Howard    MRN: [id number] DOB: 03-27-1956  09/07/2019  Ian Howard was observed post Covid-19 immunization for 15 minutes without incident. He was provided with Vaccine Information Sheet and instruction to access the V-Safe system.   Ian Howard was instructed to call 911 with any severe reactions post vaccine: Marland Kitchen Difficulty breathing  . Swelling of face and throat  . A fast heartbeat  . A bad rash all over body  . Dizziness and weakness   Immunizations Administered    Name Date Dose VIS Date Route   Pfizer COVID-19 Vaccine 09/07/2019 10:20 AM 0.3 mL 05/12/2019 Intramuscular   Manufacturer: [employer], [employer]   Lot: [id number]   NDC: [address]-8

## 2019-10-04 ENCOUNTER — Ambulatory Visit: Payer: 59 | Attending: Internal Medicine

## 2019-10-04 DIAGNOSIS — Z23 Encounter for immunization: Secondary | ICD-10-CM

## 2019-10-04 NOTE — Progress Notes (Signed)
   Covid-19 Vaccination Clinic  Name:  FESTUS PURSEL    MRN: [id number] DOB: Dec 31, 1955  10/04/2019  Mr. Kye was observed post Covid-19 immunization for 15 minutes without incident. He was provided with Vaccine Information Sheet and instruction to access the V-Safe system.   Mr. Blazier was instructed to call 911 with any severe reactions post vaccine: Marland Kitchen Difficulty breathing  . Swelling of face and throat  . A fast heartbeat  . A bad rash all over body  . Dizziness and weakness   Immunizations Administered    Name Date Dose VIS Date Route   Pfizer COVID-19 Vaccine 10/04/2019  9:56 AM 0.3 mL 07/26/2018 Intramuscular   Manufacturer: [employer], [employer]   Lot: [id number]   NDC: [address]-7

## 2020-05-18 IMAGING — MR MR CERVICAL SPINE W/O CM
5 series · 34 of 48 positions shown · non-contrast
Comparison: 07/17/2016

CLINICAL DATA: Cervical radiculopathy. Neck pain radiating down
both arms

EXAM:
MRI CERVICAL SPINE WITHOUT CONTRAST
TECHNIQUE: Multiplanar, multisequence MR imaging of the cervical spine was
performed. No intravenous contrast was administered.

[Series 2: T2 · sagittal · 3.0mm · 0.56mm/px · 7 of 13 slices shown (1 of 2)]
[im 1/13]
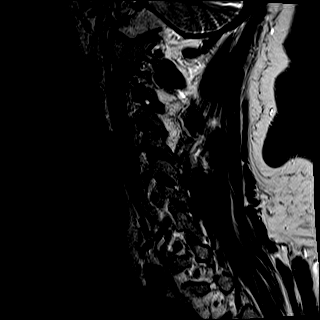
[im 3/13]
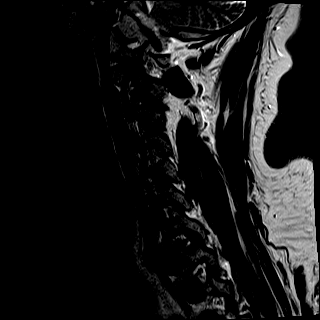
[im 5/13]
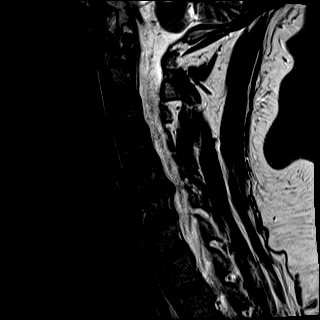
[im 7/13]
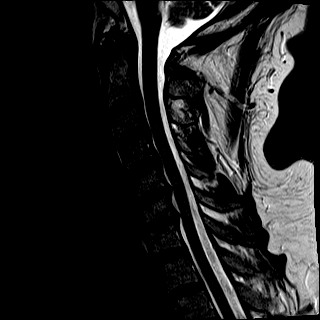
[im 9/13]
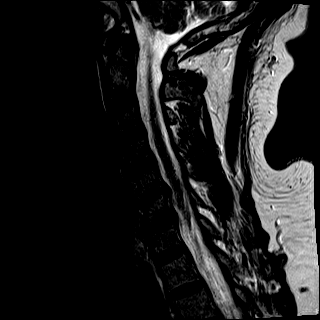
[im 11/13]
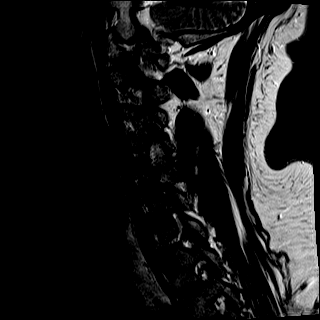
[im 13/13]
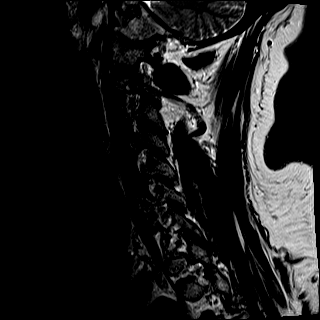

[Series 3: T1 · sagittal · 3.0mm · 0.70mm/px · 7 of 13 slices shown]
[im 1/13]
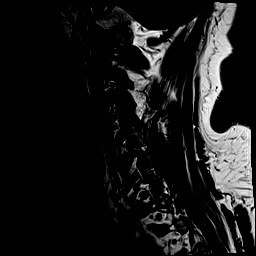
[im 3/13]
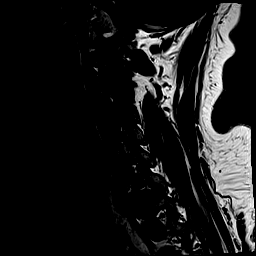
[im 5/13]
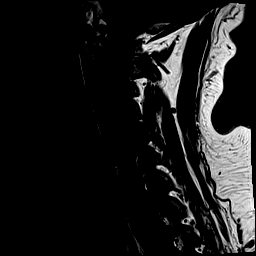
[im 7/13]
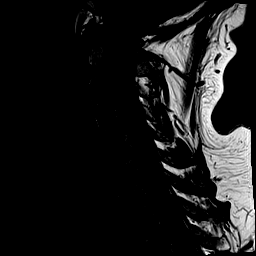
[im 9/13]
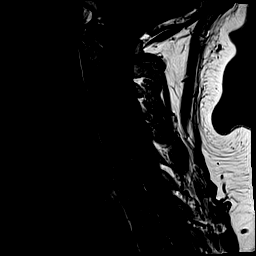
[im 11/13]
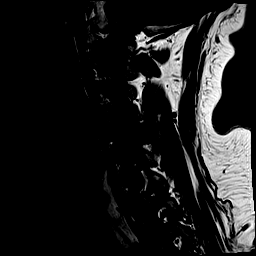
[im 13/13]
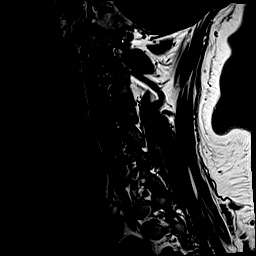

[Series 4: STIR · sagittal · 3.0mm · 0.35mm/px · 6 of 13 slices shown]
[im 1/13]
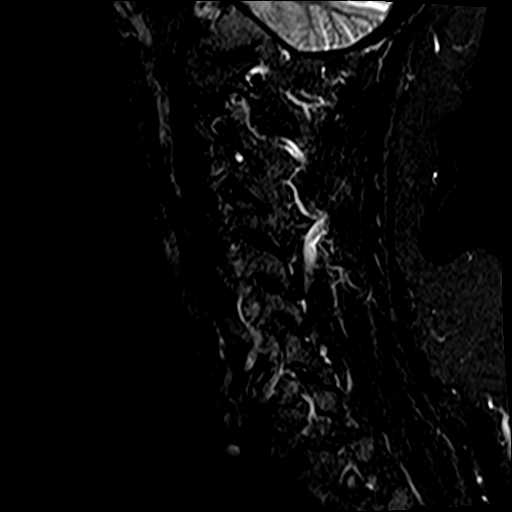
[im 3/13]
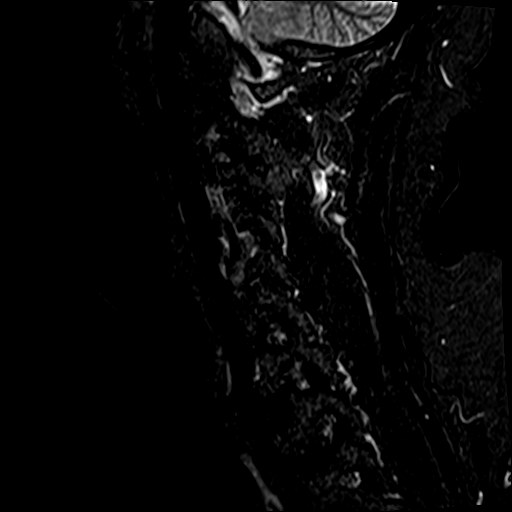
[im 5/13]
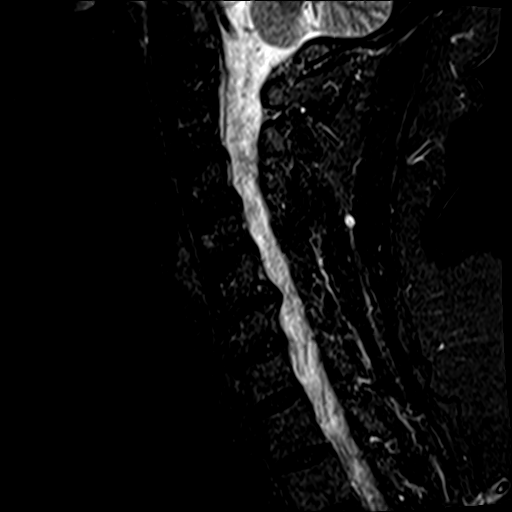
[im 8/13]
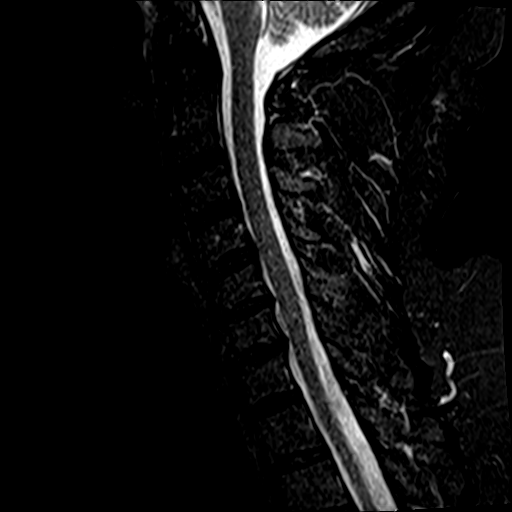
[im 10/13]
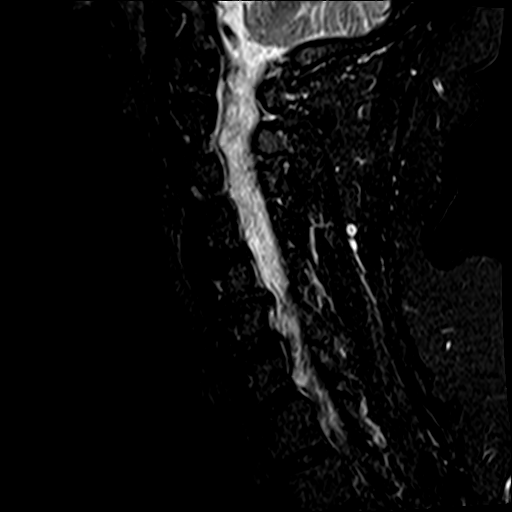
[im 13/13]
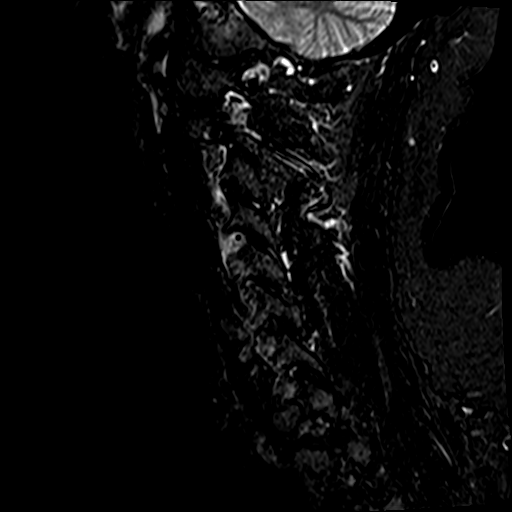

[Series 5: T2 · axial · 3.0mm · 0.62mm/px · z∈[-79,+23]mm · 8 of 29 slices shown (2 of 2)]
[im 1/29]
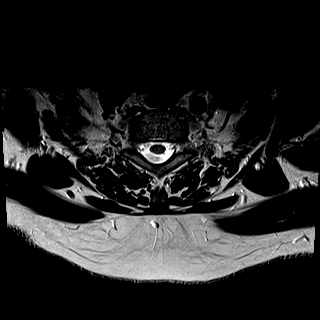
[im 5/29]
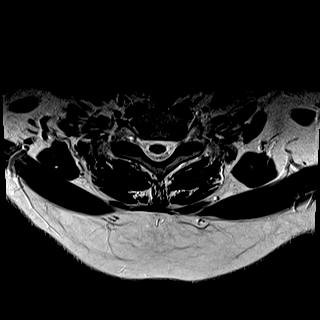
[im 9/29]
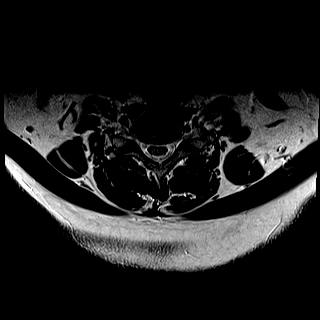
[im 13/29]
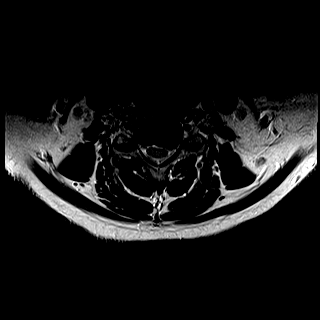
[im 16/29]
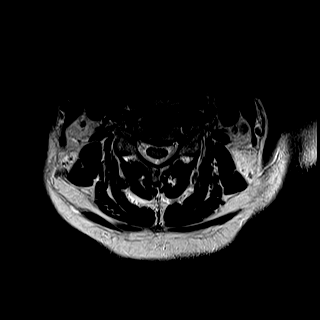
[im 20/29]
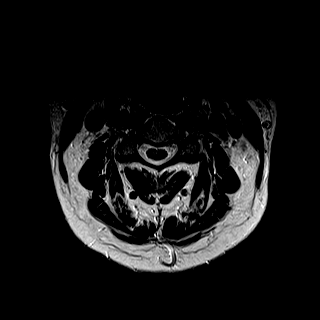
[im 24/29]
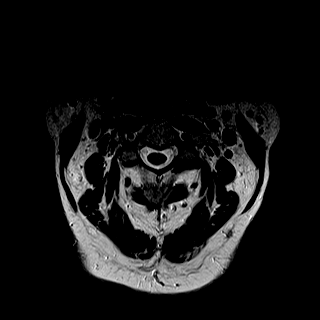
[im 29/29]
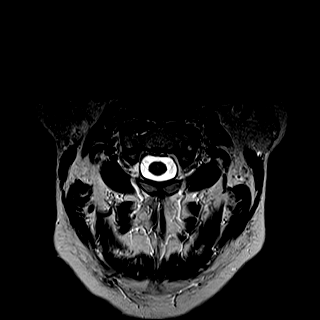

[Series 6: mpgr ax · axial · 3.0mm · 0.39mm/px · z∈[-70,-1]mm · 6 of 29 slices shown]
[im 1/29]
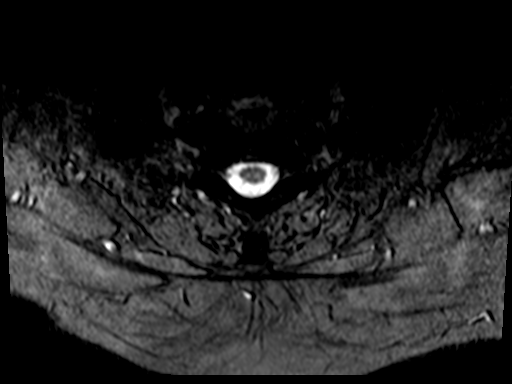
[im 5/29]
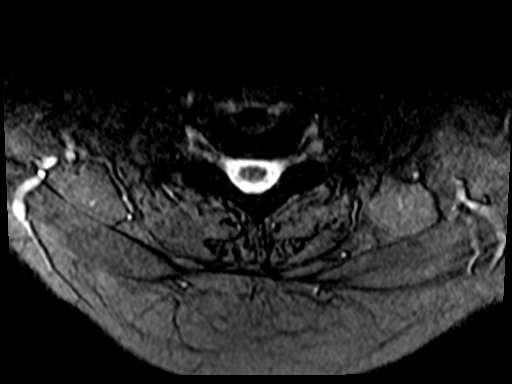
[im 9/29]
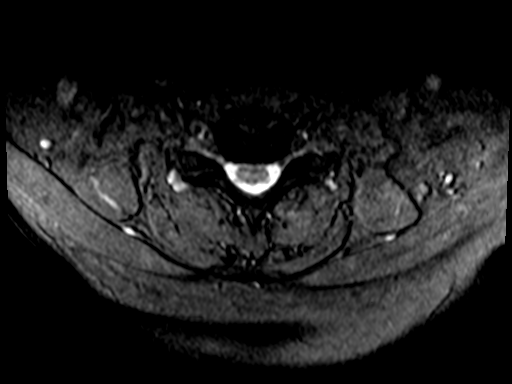
[im 13/29]
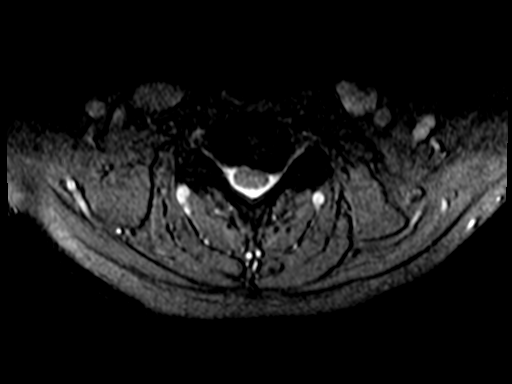
[im 16/29]
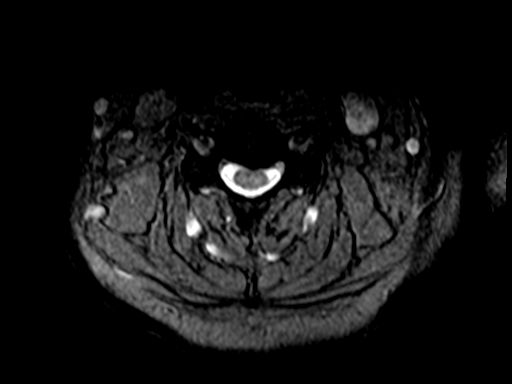
[im 20/29]
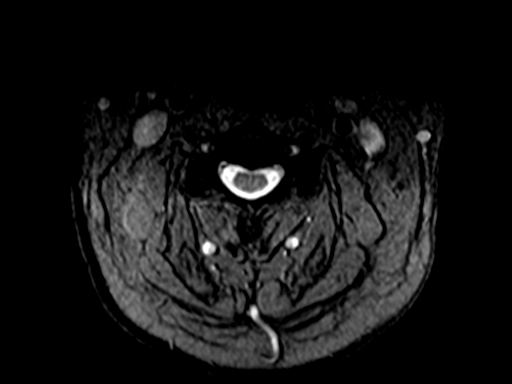

[34 of 48 positions shown; findings below may reference images not displayed]

FINDINGS: Alignment: Negative

Vertebrae: No fracture, evidence of discitis, or bone lesion.

Cord: Normal signal and morphology.

Posterior Fossa, vertebral arteries, paraspinal tissues: Negative.

Disc levels:

C2-3: Asymmetric moderate left facet spurring with mild foraminal
narrowing.

C3-4: Mild facet and left-sided uncovertebral ridging. Mild left
foraminal narrowing

C4-5: Small central protrusion suggested on axial slices. No
impingement

C5-6: Disc narrowing with bulky bilateral uncovertebral spurring and
high-grade foraminal impingement. Noncompressive spinal stenosis

C6-7: Moderate disc narrowing with endplate and uncovertebral
ridging but patent canal and foramina

C7-T1:Unremarkable.
IMPRESSION: 1. No significant change since [DATE]. C5-6 bilateral foraminal impingement from uncovertebral spurring.
3. C6-7 disc degeneration and C2-3 left facet arthropathy.

## 2020-06-13 ENCOUNTER — Other Ambulatory Visit: Payer: Self-pay | Admitting: Internal Medicine

## 2020-06-13 DIAGNOSIS — R200 Anesthesia of skin: Secondary | ICD-10-CM

## 2020-06-13 DIAGNOSIS — M542 Cervicalgia: Secondary | ICD-10-CM

## 2020-06-29 ENCOUNTER — Telehealth: Payer: BC Managed Care – PPO | Admitting: Oncology

## 2020-06-29 DIAGNOSIS — U071 COVID-19: Secondary | ICD-10-CM | POA: Diagnosis not present

## 2020-06-29 MED ORDER — PREDNISONE 50 MG PO TABS: ORAL_TABLET | ORAL | 0 refills | Status: DC

## 2020-06-29 MED ORDER — HYDROCOD POLST-CPM POLST ER 10-8 MG/5ML PO SUER: 5.0000 mL | Freq: Two times a day (BID) | ORAL | 0 refills | Status: AC

## 2020-06-29 MED ORDER — AZITHROMYCIN 250 MG PO TABS: ORAL_TABLET | ORAL | 0 refills | Status: DC

## 2020-06-29 NOTE — Progress Notes (Signed)
Ian Howard, Howard are scheduled for a virtual visit with your provider today.    Just as we do with appointments in the office, we must obtain your consent to participate.  Your consent will be active for this visit and any virtual visit you may have with one of our providers in the next 365 days.    If you have a MyChart account, I can also send a copy of this consent to you electronically.  All virtual visits are billed to your insurance company just like a traditional visit in the office.  As this is a virtual visit, video technology does not allow for your provider to perform a traditional examination.  This may limit your provider's ability to fully assess your condition.  If your provider identifies any concerns that need to be evaluated in person or the need to arrange testing such as labs, EKG, etc, we will make arrangements to do so.    Although advances in technology are sophisticated, we cannot ensure that it will always work on either your end or our end.  If the connection with a video visit is poor, we may have to switch to a telephone visit.  With either a video or telephone visit, we are not always able to ensure that we have a secure connection.   I need to obtain your verbal consent now.   Are you willing to proceed with your visit today?   Ian Howard has provided verbal consent on 06/29/2020 for a virtual visit (video or telephone).   Mauro Kaufmann, NP 06/29/2020  7:11 PM  Virtual Visit via Video Note  I connected with Ian Howard on 06/29/20 at  6:30 PM EST by a video enabled telemedicine application and verified that I am speaking with the correct person using two identifiers.  Location: Patient: Home Provider: Home office   I discussed the limitations of evaluation and management by telemedicine and the availability of in person appointments. The patient expressed understanding and agreed to proceed.  History of Present Illness: Ian Howard is a 65 year old male  with past medical history significant for GERD, hypothyroidism, microscopic hematuria, iron deficiency anemia, hypertension, asthma and obesity who presents for [facility] video visit for COVID-19.  He tested positive for Covid on 2 weeks ago. His symptoms include cough, fatigue and weakness.  Symptoms have been fairly stable for the past 2 weeks.  His appetite has declined.  He is staying hydrated.  He takes ibuprofen 600 mg 3 times per day for chronic back pain. Had one episode fever this morning of 101.7. He is also taking Robitussin-DM for cough which is helping some but he is having continued trouble sleeping at night.  He is concerned about his extreme fatigue and weakness and wondering if this is normal for COVID.   Observations/Objective: Review of Systems  Constitutional: Positive for fever and malaise/fatigue. Negative for chills and weight loss.  HENT: Positive for congestion. Negative for ear pain and tinnitus.   Eyes: Negative.  Negative for blurred vision and double vision.  Respiratory: Positive for cough, sputum production and shortness of breath.   Cardiovascular: Negative.  Negative for chest pain, palpitations and leg swelling.  Gastrointestinal: Negative.  Negative for abdominal pain, constipation, diarrhea, nausea and vomiting.  Genitourinary: Negative for dysuria, frequency and urgency.  Musculoskeletal: Negative for back pain and falls.  Skin: Negative.  Negative for rash.  Neurological: Positive for weakness. Negative for headaches.  Endo/Heme/Allergies: Negative.  Does not bruise/bleed easily.  Psychiatric/Behavioral: Negative for depression. The patient has insomnia. The patient is not nervous/anxious.    Physical Exam Constitutional:      Appearance: Normal appearance.  Pulmonary:     Effort: No tachypnea.  Neurological:     Mental Status: He is alert.    Assessment and Plan:  COVID-19: -Symptoms have been present for greater than 2 weeks. -He has tried  symptomatic treatment with ibuprofen 3 times daily, Robitussin and albuterol inhaler.  -He has persistent fatigue and cough that is causing insomnia. -Recommend symptomatic treatment with Tylenol, Mucinex, Robitussin as needed for his symptoms. 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  -We will send prescription for Tussionex with codeine to use twice daily to help with cough especially at bedtime. -Given he had a fever this morning, would recommend Z-Pak.  Prescription sent. -Telephone number for post Covid clinic given to patient and wife. -We discussed COVID-19 quarantine.  -We discussed COVID-19 red flags.  Follow Up Instructions: -RTC as needed for worsening of symptoms.    I discussed the assessment and treatment plan with the patient. The patient was provided an opportunity to ask questions and all were answered. The patient agreed with the plan and demonstrated an understanding of the instructions.   The patient was advised to call back or seek an in-person evaluation if the symptoms worsen or if the condition fails to improve as anticipated.  I provided 22 minutes of face-to-face time during this encounter.   Mauro Kaufmann, NP

## 2020-07-09 ENCOUNTER — Ambulatory Visit: Payer: BC Managed Care – PPO

## 2020-09-04 ENCOUNTER — Ambulatory Visit
Admission: RE | Admit: 2020-09-04 | Discharge: 2020-09-04 | Disposition: A | Discharge status: Home / Self Care | Payer: BC Managed Care – PPO | Source: Ambulatory Visit | Attending: Physician Assistant | Admitting: Physician Assistant

## 2020-09-04 ENCOUNTER — Other Ambulatory Visit: Payer: Self-pay

## 2020-09-04 VITALS — BP 155/92 | HR 88 | Temp 98.0°F | Resp 18 | Ht 72.0 in | Wt 283.0 lb

## 2020-09-04 DIAGNOSIS — R21 Rash and other nonspecific skin eruption: Secondary | ICD-10-CM | POA: Diagnosis not present

## 2020-09-04 MED ORDER — CLOTRIMAZOLE-BETAMETHASONE 1-0.05 % EX CREA: TOPICAL_CREAM | CUTANEOUS | 1 refills | Status: AC

## 2020-09-04 NOTE — ED Provider Notes (Signed)
[facility]    CSN: [id number] Arrival date & time: 09/04/20  1145      History   Chief Complaint Chief Complaint  Patient presents with  . Appointment  . Rash    HPI Ian Howard is a 65 y.o. male presenting with rash of the left axillary region x 3 weeks, worsening over the past 3 days.  He says it itches a little and "burns."  He says he is put hydrocortisone cream on it which seemed to improve it initially but has not seemed to help now.  Denies any rash anywhere else on his body.  Denies any contact with any known allergens.  Has not used any new body washes, soaps or detergents. No known insect bites/stings. No other complaints or concerns.  HPI  Past Medical History:  Diagnosis Date  . GERD (gastroesophageal reflux disease)   . Hypothyroid     Patient Active Problem List   Diagnosis Date Noted  . Normocytic anemia 02/03/2019  . Iron deficiency anemia 02/03/2019  . GERD (gastroesophageal reflux disease) 02/03/2019  . Irritable bowel syndrome 02/03/2019  . Microscopic hematuria 02/03/2019  . BMI 40.0-44.9, adult (HCC) 11/28/2018  . Cervical radiculopathy at C6 07/02/2016  . Blood pressure elevated without history of HTN 02/20/2016  . Allergic rhinitis due to allergen 05/16/2014  . Hypothyroidism due to acquired atrophy of thyroid 05/16/2014  . Hypothyroidism 01/30/1989    Past Surgical History:  Procedure Laterality Date  . gall bladder remove    . HERNIA REPAIR  2003       Home Medications    Prior to Admission medications   Medication Sig Start Date End Date Taking? Authorizing Provider  clotrimazole-betamethasone (LOTRISONE) cream Apply to affected area 2 times daily prn 09/04/20  Yes Eusebio Friendly B, PA-C  ALBUTEROL SULFATE IN Inhale into the lungs.    [provider]  ALPRAZolam Prudy Feeler) 0.5 MG tablet Take 0.5 mg by mouth 2 (two) times daily as needed for anxiety.    [provider]  azithromycin (ZITHROMAX) 250 MG  tablet Take 2 tabs (500mg ) on day 1 and one tablet (250 mg) days 2-5. 06/29/20   07/01/20, NP  chlorpheniramine-HYDROcodone (TUSSIONEX PENNKINETIC ER) 10-8 MG/5ML SUER Take 5 mLs by mouth 2 (two) times daily. 06/29/20   07/01/20, NP  Doxycycline Hyclate 50 MG TBEC Take 50 mg by mouth daily.    [provider]  fexofenadine (ALLEGRA) 180 MG tablet Take 180 mg by mouth daily.    [provider]  fluticasone (FLONASE) 50 MCG/ACT nasal spray Place 2 sprays into both nostrils daily.    [provider]  gabapentin (NEURONTIN) 300 MG capsule Take 300 mg by mouth 2 (two) times daily.    [provider]  ibuprofen (ADVIL,MOTRIN) 200 MG tablet Take 200 mg by mouth every 6 (six) hours as needed.    [provider]  iron polysaccharides (NIFEREX) 150 MG capsule Take 150 mg by mouth daily.    [provider]  Levothyroxine Sodium 150 MCG CAPS Take by mouth daily before breakfast.    [provider]  omeprazole (PRILOSEC) 20 MG capsule Take 20 mg by mouth daily.    [provider]  predniSONE (DELTASONE) 50 MG tablet Take as directed. 06/29/20   07/01/20, NP  pseudoephedrine (SUDAFED) 30 MG tablet Take 30 mg by mouth every 4 (four) hours as needed for congestion.    [provider]  sildenafil (VIAGRA)  100 MG tablet Take 100 mg by mouth as needed for erectile dysfunction.    [provider]    Family History Family History  Problem Relation Age of Onset  . Lung cancer Father   . Colon cancer Maternal Aunt     Social History Social History   Tobacco Use  . Smoking status: Never Smoker  . Smokeless tobacco: Never Used  Substance Use Topics  . Alcohol use: Yes    Comment: occasionally  . Drug use: No     Allergies   Patient has no known allergies.   Review of Systems Review of Systems  Constitutional: Negative for fatigue and fever.  Musculoskeletal: Negative for myalgias.   Skin: Positive for rash.  Allergic/Immunologic: Negative for environmental allergies and food allergies.     Physical Exam Triage Vital Signs ED Triage Vitals  Enc Vitals Group     BP 09/04/20 1209 (!) 155/92     Pulse Rate 09/04/20 1209 88     Resp 09/04/20 1209 18     Temp 09/04/20 1209 98 F (36.7 C)     Temp Source 09/04/20 1209 Oral     SpO2 09/04/20 1209 100 %     Weight 09/04/20 1207 283 lb (128.4 kg)     Height 09/04/20 1207 6' (1.829 m)     Head Circumference --      Peak Flow --      Pain Score 09/04/20 1207 0     Pain Loc --      Pain Edu? --      Excl. in GC? --    No data found.  Updated Vital Signs BP (!) 155/92 (BP Location: Right Arm)   Pulse 88   Temp 98 F (36.7 C) (Oral)   Resp 18   Ht 6' (1.829 m)   Wt 283 lb (128.4 kg)   SpO2 100%   BMI 38.38 kg/m      Physical Exam Vitals and nursing note reviewed.  Constitutional:      General: He is not in acute distress.    Appearance: Normal appearance. He is well-developed. He is not ill-appearing.  HENT:     Head: Normocephalic and atraumatic.  Eyes:     General: No scleral icterus.    Conjunctiva/sclera: Conjunctivae normal.  Cardiovascular:     Rate and Rhythm: Normal rate and regular rhythm.     Heart sounds: Normal heart sounds.  Pulmonary:     Effort: Pulmonary effort is normal. No respiratory distress.     Breath sounds: Normal breath sounds.  Musculoskeletal:     Cervical back: Neck supple.  Skin:    General: Skin is warm and dry.     Findings: Rash (erythematous dry maculopapular rash of left axillary region) present.  Neurological:     General: No focal deficit present.     Mental Status: He is alert. Mental status is at baseline.  Psychiatric:        Mood and Affect: Mood normal.        Behavior: Behavior normal.        Thought Content: Thought content normal.      UC Treatments / Results  Labs (all labs ordered are listed, but only abnormal results are displayed) Labs  Reviewed - No data to display  EKG   Radiology No results found.  Procedures Procedures (including critical care time)  Medications Ordered in UC Medications - No data to display  Initial Impression / Assessment and  Plan / UC Course  I have reviewed the triage vital signs and the nursing notes.  Pertinent labs & imaging results that were available during my care of the patient were reviewed by me and considered in my medical decision making (see chart for details).   65 year old male presenting for rash to left axillary region x3 weeks, worse over the past 3 days.  Symptoms consistent with possible fungal rash versus contact or allergic dermatitis.  Send Lotrisone.  Advised supportive care.  Advised to follow-up with our department if not improving with this medication after 7 to 10 days or for any worsening symptoms.  Final Clinical Impressions(s) / UC Diagnoses   Final diagnoses:  Rash     Discharge Instructions     This could be fungal rash or related to something your skin came into contact with. Start lotrisone which has antifungal and corticosteroid in it. If no improvement in 10 days or worsening symptoms, please call or return.    ED Prescriptions    Medication Sig Dispense Auth. Provider   clotrimazole-betamethasone (LOTRISONE) cream Apply to affected area 2 times daily prn 15 g Shirlee Latch, PA-C     PDMP not reviewed this encounter.   Shirlee Latch, PA-C 09/04/20 1258

## 2020-09-04 NOTE — Discharge Instructions (Signed)
This could be fungal rash or related to something your skin came into contact with. Start lotrisone which has antifungal and corticosteroid in it. If no improvement in 10 days or worsening symptoms, please call or return.

## 2020-09-04 NOTE — ED Triage Notes (Signed)
Patient c/o rash under his left axilla that started 3 weeks ago.

## 2021-06-19 ENCOUNTER — Ambulatory Visit
Admission: RE | Admit: 2021-06-19 | Discharge: 2021-06-19 | Disposition: A | Discharge status: Home / Self Care | Payer: BC Managed Care – PPO | Source: Ambulatory Visit | Attending: Student | Admitting: Student

## 2021-06-19 ENCOUNTER — Other Ambulatory Visit: Payer: Self-pay

## 2021-06-19 VITALS — BP 149/83 | HR 73 | Temp 98.4°F | Resp 18

## 2021-06-19 DIAGNOSIS — J0101 Acute recurrent maxillary sinusitis: Secondary | ICD-10-CM

## 2021-06-19 MED ORDER — AMOXICILLIN-POT CLAVULANATE 875-125 MG PO TABS: 1.0000 | ORAL_TABLET | Freq: Two times a day (BID) | ORAL | 0 refills | Status: DC

## 2021-06-19 NOTE — ED Provider Notes (Signed)
[facility]    CSN: [id number] Arrival date & time: 06/19/21  1350      History   Chief Complaint Chief Complaint  Patient presents with   Nasal Congestion   Headache    HPI JOH DROGE is a 66 y.o. male presenting with congestion and facial pain x3 weeks. Medical history noncontributory. States initially had nasal congestion 3 weeks ago, this progressed into facial pain below his eyes. Improved following z-pack but then symptoms returned x1 week. Now again with pain below eyes radiating to teeth. Subjective chills. Has taken ibuprofen with some relief. Denies cough, n/v/d/c, SOB, CP.  HPI  Past Medical History:  Diagnosis Date   GERD (gastroesophageal reflux disease)    Hypothyroid     Patient Active Problem List   Diagnosis Date Noted   Normocytic anemia 02/03/2019   Iron deficiency anemia 02/03/2019   GERD (gastroesophageal reflux disease) 02/03/2019   Irritable bowel syndrome 02/03/2019   Microscopic hematuria 02/03/2019   BMI 40.0-44.9, adult ([facility]) 11/28/2018   Cervical radiculopathy at C6 07/02/2016   Blood pressure elevated without history of HTN 02/20/2016   Allergic rhinitis due to allergen 05/16/2014   Hypothyroidism due to acquired atrophy of thyroid 05/16/2014   Hypothyroidism 01/30/1989    Past Surgical History:  Procedure Laterality Date   gall bladder remove     HERNIA REPAIR  2003       Home Medications    Prior to Admission medications   Medication Sig Start Date End Date Taking? Authorizing Provider  amoxicillin-clavulanate (AUGMENTIN) 875-125 MG tablet Take 1 tablet by mouth every 12 (twelve) hours. 06/19/21  Yes Hazel Sams, PA-C  ALBUTEROL SULFATE IN Inhale into the lungs.    [provider]  ALPRAZolam Duanne Moron) 0.5 MG tablet Take 0.5 mg by mouth 2 (two) times daily as needed for anxiety.    [provider]  azithromycin (ZITHROMAX) 250 MG tablet Take 2 tabs (500mg ) on day 1 and one tablet (250 mg) days  2-5. 06/29/20   Jacquelin Hawking, NP  chlorpheniramine-HYDROcodone (TUSSIONEX PENNKINETIC ER) 10-8 MG/5ML SUER Take 5 mLs by mouth 2 (two) times daily. 06/29/20   Jacquelin Hawking, NP  clotrimazole-betamethasone (LOTRISONE) cream Apply to affected area 2 times daily prn 09/04/20   Danton Clap, PA-C  fexofenadine (ALLEGRA) 180 MG tablet Take 180 mg by mouth daily.    [provider]  fluticasone (FLONASE) 50 MCG/ACT nasal spray Place 2 sprays into both nostrils daily.    [provider]  gabapentin (NEURONTIN) 300 MG capsule Take 300 mg by mouth 2 (two) times daily.    [provider]  ibuprofen (ADVIL,MOTRIN) 200 MG tablet Take 200 mg by mouth every 6 (six) hours as needed.    [provider]  iron polysaccharides (NIFEREX) 150 MG capsule Take 150 mg by mouth daily.    [provider]  Levothyroxine Sodium 150 MCG CAPS Take by mouth daily before breakfast.    [provider]  omeprazole (PRILOSEC) 20 MG capsule Take 20 mg by mouth daily.    [provider]  predniSONE (DELTASONE) 50 MG tablet Take as directed. 06/29/20   Jacquelin Hawking, NP  pseudoephedrine (SUDAFED) 30 MG tablet Take 30 mg by mouth every 4 (four) hours as needed for congestion.    [provider]  sildenafil (VIAGRA) 100 MG tablet Take 100 mg by mouth as needed for erectile dysfunction.    [provider]    [facility]  History Family History  Problem Relation Age of Onset   Lung cancer Father    Colon cancer Maternal Aunt     Social History Social History   Tobacco Use   Smoking status: Never   Smokeless tobacco: Never  Substance Use Topics   Alcohol use: Yes    Comment: occasionally   Drug use: No     Allergies   Patient has no known allergies.   Review of Systems Review of Systems  Constitutional:  Positive for chills. Negative for appetite change and fever.  HENT:  Positive for congestion and sinus pressure. Negative for ear  pain, rhinorrhea, sinus pain and sore throat.   Eyes:  Negative for redness and visual disturbance.  Respiratory:  Negative for cough, chest tightness, shortness of breath and wheezing.   Cardiovascular:  Negative for chest pain and palpitations.  Gastrointestinal:  Negative for abdominal pain, constipation, diarrhea, nausea and vomiting.  Genitourinary:  Negative for dysuria, frequency and urgency.  Musculoskeletal:  Negative for myalgias.  Neurological:  Negative for dizziness, weakness and headaches.  Psychiatric/Behavioral:  Negative for confusion.   All other systems reviewed and are negative.   Physical Exam Triage Vital Signs ED Triage Vitals  Enc Vitals Group     BP 06/19/21 1404 (!) 149/83     Pulse Rate 06/19/21 1404 73     Resp 06/19/21 1404 18     Temp 06/19/21 1404 98.4 F (36.9 C)     Temp src --      SpO2 06/19/21 1404 97 %     Weight --      Height --      Head Circumference --      Peak Flow --      Pain Score 06/19/21 1412 3     Pain Loc --      Pain Edu? --      Excl. in GC? --    No data found.  Updated Vital Signs BP (!) 149/83 (BP Location: Left Arm)    Pulse 73    Temp 98.4 F (36.9 C)    Resp 18    SpO2 97%   Visual Acuity Right Eye Distance:   Left Eye Distance:   Bilateral Distance:    Right Eye Near:   Left Eye Near:    Bilateral Near:     Physical Exam Vitals reviewed.  Constitutional:      General: He is not in acute distress.    Appearance: Normal appearance. He is not ill-appearing.  HENT:     Head: Normocephalic and atraumatic.     Right Ear: Tympanic membrane, ear canal and external ear normal. No tenderness. No middle ear effusion. There is no impacted cerumen. Tympanic membrane is not perforated, erythematous, retracted or bulging.     Left Ear: Tympanic membrane, ear canal and external ear normal. No tenderness.  No middle ear effusion. There is no impacted cerumen. Tympanic membrane is not perforated, erythematous, retracted  or bulging.     Nose: Congestion present.     Right Sinus: Maxillary sinus tenderness present. No frontal sinus tenderness.     Left Sinus: Maxillary sinus tenderness present. No frontal sinus tenderness.     Mouth/Throat:     Mouth: Mucous membranes are moist.     Pharynx: Uvula midline. No oropharyngeal exudate or posterior oropharyngeal erythema.  Eyes:     Extraocular Movements: Extraocular movements intact.     Pupils: Pupils are equal, round, and reactive to light.  Cardiovascular:     Rate and Rhythm: Normal rate and regular rhythm.     Heart sounds: Normal heart sounds.  Pulmonary:     Effort: Pulmonary effort is normal.     Breath sounds: Normal breath sounds. No decreased breath sounds, wheezing, rhonchi or rales.  Abdominal:     Palpations: Abdomen is soft.     Tenderness: There is no abdominal tenderness. There is no guarding or rebound.  Lymphadenopathy:     Cervical: No cervical adenopathy.     Right cervical: No superficial cervical adenopathy.    Left cervical: No superficial cervical adenopathy.  Neurological:     General: No focal deficit present.     Mental Status: He is alert and oriented to person, place, and time.  Psychiatric:        Mood and Affect: Mood normal.        Behavior: Behavior normal.        Thought Content: Thought content normal.        Judgment: Judgment normal.     UC Treatments / Results  Labs (all labs ordered are listed, but only abnormal results are displayed) Labs Reviewed - No data to display  EKG   Radiology No results found.  Procedures Procedures (including critical care time)  Medications Ordered in UC Medications - No data to display  Initial Impression / Assessment and Plan / UC Course  I have reviewed the triage vital signs and the nursing notes.  Pertinent labs & imaging results that were available during my care of the patient were reviewed by me and considered in my medical decision making (see chart for  details).     This patient is a very pleasant 66 y.o. year old male presenting with sinusitis x3 weeks. Afebrile, nontachy. Was treated with z-pack already without resolution. Augmentin sent as below. Continue daily flonase. ED return precautions discussed. Patient verbalizes understanding and agreement.   .   Final Clinical Impressions(s) / UC Diagnoses   Final diagnoses:  Acute recurrent maxillary sinusitis     Discharge Instructions      -Start the antibiotic-Augmentin (amoxicillin-clavulanate), 1 pill every 12 hours for 7 days.  You can take this with food like with breakfast and dinner. -Flonase nasal steroid: place 2 sprays into both nostrils in the morning and at bedtime for at least 7 days. Continue for longer if this is helping.    ED Prescriptions     Medication Sig Dispense Auth. Provider   amoxicillin-clavulanate (AUGMENTIN) 875-125 MG tablet Take 1 tablet by mouth every 12 (twelve) hours. 14 tablet Hazel Sams, PA-C      PDMP not reviewed this encounter.   Hazel Sams, PA-C 06/19/21 1433

## 2021-06-19 NOTE — ED Triage Notes (Signed)
Pt here with 3 weeks of congestion and facial pain. Received a Zpack about 2 weeks ago, felt better, but now sx are better.

## 2021-06-19 NOTE — Discharge Instructions (Addendum)
-  Start the antibiotic-Augmentin (amoxicillin-clavulanate), 1 pill every 12 hours for 7 days.  You can take this with food like with breakfast and dinner. -Flonase nasal steroid: place 2 sprays into both nostrils in the morning and at bedtime for at least 7 days. Continue for longer if this is helping.  

## 2023-03-12 ENCOUNTER — Ambulatory Visit
Admission: RE | Admit: 2023-03-12 | Discharge: 2023-03-12 | Disposition: A | Discharge status: Home / Self Care | Payer: BC Managed Care – PPO | Source: Ambulatory Visit | Attending: Family Medicine | Admitting: Family Medicine

## 2023-03-12 ENCOUNTER — Other Ambulatory Visit: Payer: Self-pay

## 2023-03-12 VITALS — BP 135/79 | HR 66 | Temp 98.5°F | Resp 16

## 2023-03-12 DIAGNOSIS — J019 Acute sinusitis, unspecified: Secondary | ICD-10-CM

## 2023-03-12 MED ORDER — PREDNISONE 20 MG PO TABS: 40.0000 mg | ORAL_TABLET | Freq: Every day | ORAL | 0 refills | Status: AC

## 2023-03-12 MED ORDER — CEFDINIR 300 MG PO CAPS: 600.0000 mg | ORAL_CAPSULE | Freq: Every day | ORAL | 0 refills | Status: AC

## 2023-03-12 NOTE — ED Triage Notes (Signed)
Pt sts sinus pressure and HA x 5 days; pt sts hx of similar recently that was treated but sx did not fully resolve; pt sts drainage

## 2023-03-12 NOTE — ED Provider Notes (Signed)
Ian Howard    CSN: [id number] Arrival date & time: 03/12/23  1416      History   Chief Complaint Chief Complaint  Patient presents with   Headache    brown- red sinus drainage, pressure behind eyes - Entered by patient   Nasal Congestion    HPI Ian Howard is a 67 y.o. male.    Headache  For nasal congestion and bloody and brown rhinorrhea.  Symptoms began about 4 - 5 days ago.  He has not had any fever.  Sometimes his teeth hurt and his sinuses hurt worse when he bends over.  About 2 or 3 months ago he was treated with possibly Amoxil for Augmentin for sinus infection.  He did improve but feels that the symptoms never completely went away, as he had some waxing and waning sinus congestion still and some postnasal discharge.   Past Medical History:  Diagnosis Date   GERD (gastroesophageal reflux disease)    Hypothyroid     Patient Active Problem List   Diagnosis Date Noted   Normocytic anemia 02/03/2019   Iron deficiency anemia 02/03/2019   GERD (gastroesophageal reflux disease) 02/03/2019   Irritable bowel syndrome 02/03/2019   Microscopic hematuria 02/03/2019   BMI 40.0-44.9, adult ([facility]) 11/28/2018   Cervical radiculopathy at C6 07/02/2016   Blood pressure elevated without history of HTN 02/20/2016   Allergic rhinitis due to allergen 05/16/2014   Hypothyroidism due to acquired atrophy of thyroid 05/16/2014   Hypothyroidism 01/30/1989    Past Surgical History:  Procedure Laterality Date   gall bladder remove     HERNIA REPAIR  2003       Home Medications    Prior to Admission medications   Medication Sig Start Date End Date Taking? Authorizing Provider  cefdinir (OMNICEF) 300 MG capsule Take 2 capsules (600 mg total) by mouth daily for 7 days. 03/12/23 03/19/23 Yes Yanin Muhlestein, Janace Aris, MD  predniSONE (DELTASONE) 20 MG tablet Take 2 tablets (40 mg total) by mouth daily with breakfast for 3 days. 03/12/23 03/15/23 Yes Zenia Resides, MD   ALBUTEROL SULFATE IN Inhale into the lungs.    [provider]  ALPRAZolam Prudy Feeler) 0.5 MG tablet Take 0.5 mg by mouth 2 (two) times daily as needed for anxiety.    [provider]  chlorpheniramine-HYDROcodone (TUSSIONEX PENNKINETIC ER) 10-8 MG/5ML SUER Take 5 mLs by mouth 2 (two) times daily. 06/29/20   Mauro Kaufmann, NP  clotrimazole-betamethasone (LOTRISONE) cream Apply to affected area 2 times daily prn 09/04/20   Shirlee Latch, PA-C  fexofenadine (ALLEGRA) 180 MG tablet Take 180 mg by mouth daily.    [provider]  fluticasone (FLONASE) 50 MCG/ACT nasal spray Place 2 sprays into both nostrils daily.    [provider]  gabapentin (NEURONTIN) 300 MG capsule Take 300 mg by mouth 2 (two) times daily.    [provider]  ibuprofen (ADVIL,MOTRIN) 200 MG tablet Take 200 mg by mouth every 6 (six) hours as needed.    [provider]  iron polysaccharides (NIFEREX) 150 MG capsule Take 150 mg by mouth daily.    [provider]  Levothyroxine Sodium 150 MCG CAPS Take by mouth daily before breakfast.    [provider]  omeprazole (PRILOSEC) 20 MG capsule Take 20 mg by mouth daily.    [provider]  pseudoephedrine (SUDAFED) 30 MG tablet Take 30 mg by mouth every 4 (four) hours as needed for congestion.  [provider]  sildenafil (VIAGRA) 100 MG tablet Take 100 mg by mouth as needed for erectile dysfunction.    [provider]    Family History Family History  Problem Relation Age of Onset   Lung cancer Father    Colon cancer Maternal Aunt     Social History Social History   Tobacco Use   Smoking status: Never   Smokeless tobacco: Never  Substance Use Topics   Alcohol use: Yes    Comment: occasionally   Drug use: No     Allergies   Patient has no known allergies.   Review of Systems Review of Systems  Neurological:  Positive for headaches.     Physical Exam Triage  Vital Signs ED Triage Vitals  Encounter Vitals Group     BP 03/12/23 1450 135/79     Systolic BP Percentile --      Diastolic BP Percentile --      Pulse Rate 03/12/23 1450 66     Resp 03/12/23 1450 16     Temp 03/12/23 1450 98.5 F (36.9 C)     Temp Source 03/12/23 1450 Oral     SpO2 03/12/23 1450 98 %     Weight --      Height --      Head Circumference --      Peak Flow --      Pain Score 03/12/23 1453 4     Pain Loc --      Pain Education --      Exclude from Growth Chart --    No data found.  Updated Vital Signs BP 135/79 (BP Location: Right Arm)   Pulse 66   Temp 98.5 F (36.9 C) (Oral)   Resp 16   SpO2 98%   Visual Acuity Right Eye Distance:   Left Eye Distance:   Bilateral Distance:    Right Eye Near:   Left Eye Near:    Bilateral Near:     Physical Exam Vitals reviewed.  Constitutional:      General: He is not in acute distress.    Appearance: He is not ill-appearing, toxic-appearing or diaphoretic.  HENT:     Right Ear: Tympanic membrane and ear canal normal.     Left Ear: Tympanic membrane and ear canal normal.     Nose: Congestion present.     Mouth/Throat:     Mouth: Mucous membranes are moist.     Pharynx: No oropharyngeal exudate or posterior oropharyngeal erythema.  Eyes:     Extraocular Movements: Extraocular movements intact.     Conjunctiva/sclera: Conjunctivae normal.     Pupils: Pupils are equal, round, and reactive to light.  Cardiovascular:     Rate and Rhythm: Normal rate and regular rhythm.     Heart sounds: No murmur heard. Pulmonary:     Effort: No respiratory distress.     Breath sounds: No stridor. No wheezing, rhonchi or rales.  Musculoskeletal:     Cervical back: Neck supple.  Lymphadenopathy:     Cervical: No cervical adenopathy.  Skin:    Capillary Refill: Capillary refill takes less than 2 seconds.     Coloration: Skin is not jaundiced or pale.  Neurological:     General: No focal deficit present.     Mental  Status: He is alert and oriented to person, place, and time.  Psychiatric:        Behavior: Behavior normal.      UC Treatments / Results  Labs (all labs ordered are listed, but only abnormal results are displayed) Labs Reviewed - No data to display  EKG   Radiology No results found.  Procedures Procedures (including critical care time)  Medications Ordered in UC Medications - No data to display  Initial Impression / Assessment and Plan / UC Course  I have reviewed the triage vital signs and the nursing notes.  Pertinent labs & imaging results that were available during my care of the patient were reviewed by me and considered in my medical decision making (see chart for details).       Truman Hayward is sent in to treat sinusitis.  He is already on Flonase.  He is going to use some saline nasal spray also.  He will follow-up with primary care Final Clinical Impressions(s) / UC Diagnoses   Final diagnoses:  Acute sinusitis, recurrence not specified, unspecified location     Discharge Instructions      Take cefdinir 300 mg--2 capsules together daily for 7 days  Take prednisone 20 mg--2 daily for 3 days  Please follow-up with your primary care about this issue     ED Prescriptions     Medication Sig Dispense Auth. Provider   cefdinir (OMNICEF) 300 MG capsule Take 2 capsules (600 mg total) by mouth daily for 7 days. 14 capsule Zenia Resides, MD   predniSONE (DELTASONE) 20 MG tablet Take 2 tablets (40 mg total) by mouth daily with breakfast for 3 days. 6 tablet Marlinda Mike Janace Aris, MD      PDMP not reviewed this encounter.   Zenia Resides, MD 03/12/23 [phone]

## 2023-03-12 NOTE — Discharge Instructions (Signed)
Take cefdinir 300 mg--2 capsules together daily for 7 days  Take prednisone 20 mg--2 daily for 3 days  Please follow-up with your primary care about this issue
